# Patient Record
Sex: Female | Born: 1990 | Hispanic: No | Marital: Single | Smoking: Never smoker
Health system: Southern US, Community
[De-identification: ages and names within clinical notes are randomized; demographics above are authoritative.]

## PROBLEM LIST (undated history)

## (undated) ENCOUNTER — Inpatient Hospital Stay (HOSPITAL_COMMUNITY): Payer: Self-pay

## (undated) DIAGNOSIS — F32A Depression, unspecified: Secondary | ICD-10-CM

## (undated) DIAGNOSIS — F419 Anxiety disorder, unspecified: Secondary | ICD-10-CM

## (undated) DIAGNOSIS — F329 Major depressive disorder, single episode, unspecified: Secondary | ICD-10-CM

## (undated) HISTORY — PX: NASAL SINUS SURGERY: SHX719

---
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
## 2014-12-25 ENCOUNTER — Inpatient Hospital Stay (HOSPITAL_COMMUNITY): Payer: Managed Care, Other (non HMO)

## 2014-12-25 ENCOUNTER — Encounter (HOSPITAL_COMMUNITY): Payer: Self-pay | Admitting: *Deleted

## 2014-12-25 ENCOUNTER — Inpatient Hospital Stay (HOSPITAL_COMMUNITY)
Admission: AD | Admit: 2014-12-25 | Discharge: 2014-12-25 | Disposition: A | Discharge status: Home / Self Care | Payer: Managed Care, Other (non HMO) | Source: Ambulatory Visit | Attending: Obstetrics and Gynecology | Admitting: Obstetrics and Gynecology

## 2014-12-25 DIAGNOSIS — O209 Hemorrhage in early pregnancy, unspecified: Secondary | ICD-10-CM | POA: Diagnosis present

## 2014-12-25 DIAGNOSIS — Z3A01 Less than 8 weeks gestation of pregnancy: Secondary | ICD-10-CM | POA: Diagnosis not present

## 2014-12-25 DIAGNOSIS — O039 Complete or unspecified spontaneous abortion without complication: Secondary | ICD-10-CM | POA: Diagnosis not present

## 2014-12-25 LAB — CBC
HCT: 36 % (ref 36.0–46.0)
Hemoglobin: 12 g/dL (ref 12.0–15.0)
MCH: 28.8 pg (ref 26.0–34.0)
MCHC: 33.3 g/dL (ref 30.0–36.0)
MCV: 86.3 fL (ref 78.0–100.0)
Platelets: 229 10*3/uL (ref 150–400)
RBC: 4.17 MIL/uL (ref 3.87–5.11)
RDW: 12.1 % (ref 11.5–15.5)
WBC: 5.3 10*3/uL (ref 4.0–10.5)

## 2014-12-25 LAB — URINALYSIS, ROUTINE W REFLEX MICROSCOPIC
Bilirubin Urine: NEGATIVE
Glucose, UA: NEGATIVE mg/dL
Ketones, ur: 15 mg/dL — AB
Leukocytes, UA: NEGATIVE
Nitrite: NEGATIVE
Protein, ur: NEGATIVE mg/dL
Specific Gravity, Urine: 1.025 (ref 1.005–1.030)
Urobilinogen, UA: 0.2 mg/dL (ref 0.0–1.0)
pH: 6 (ref 5.0–8.0)

## 2014-12-25 LAB — URINE MICROSCOPIC-ADD ON

## 2014-12-25 LAB — POCT PREGNANCY, URINE: Preg Test, Ur: POSITIVE — AB

## 2014-12-25 LAB — HCG, QUANTITATIVE, PREGNANCY: HCG, BETA CHAIN, QUANT, S: 888 m[IU]/mL — AB (ref <5)

## 2014-12-25 MED ORDER — IBUPROFEN 800 MG PO TABS: 800.0000 mg | ORAL_TABLET | Freq: Three times a day (TID) | ORAL | Status: DC

## 2014-12-25 MED ORDER — HYDROCODONE-ACETAMINOPHEN 5-325 MG PO TABS: 1.0000 | ORAL_TABLET | ORAL | Status: DC | PRN

## 2014-12-25 MED ORDER — CYTOTEC 200 MCG PO TABS: 800.0000 ug | ORAL_TABLET | Freq: Once | ORAL | Status: DC

## 2014-12-25 NOTE — MAU Note (Addendum)
I just found out i am pregnant 3 days ago. Started having bleeding late last night. Mild cramping. Used 3 pads today. [facility] and [facility] pos upt. Could not see anything on u/s.

## 2014-12-25 NOTE — Discharge Instructions (Signed)
Miscarriage °A miscarriage is the loss of an unborn baby (fetus) before the 20th week of pregnancy. The cause is often unknown.  °HOME CARE °· You may need to stay in bed (bed rest), or you may be able to do light activity. Go about activity as told by your doctor. °· Have help at home. °· Write down how many pads you use each day. Write down how soaked they are. °· Do not use tampons. Do not wash out your vagina (douche) or have sex (intercourse) until your doctor approves. °· Only take medicine as told by your doctor. °· Do not take aspirin. °· Keep all doctor visits as told. °· If you or your partner have problems with grieving, talk to your doctor. You can also try counseling. Give yourself time to grieve before trying to get pregnant again. °GET HELP RIGHT AWAY IF: °· You have bad cramps or pain in your back or belly (abdomen). °· You have a fever. °· You pass large clumps of blood (clots) from your vagina that are walnut-sized or larger. Save the clumps for your doctor to see. °· You pass large amounts of tissue from your vagina. Save the tissue for your doctor to see. °· You have more bleeding. °· You have thick, bad-smelling fluid (discharge) coming from the vagina. °· You get lightheaded, weak, or you pass out (faint). °· You have chills. °MAKE SURE YOU: °· Understand these instructions. °· Will watch your condition. °· Will get help right away if you are not doing well or get worse. °Document Released: 12/25/2011 Document Reviewed: 12/25/2011 °ExitCare® Patient Information ©2015 [employer]. This information is not intended to replace advice given to you by your health care provider. Make sure you discuss any questions you have with your health care provider. ° °

## 2014-12-25 NOTE — MAU Provider Note (Signed)
History     CSN: [id number]  Arrival date and time: 12/25/14 4098   First Provider Initiated Contact with Patient 12/25/14 2024      Chief Complaint  Patient presents with  . Vaginal Bleeding   HPI   Ms Charlotte Scott is a 24 y.o. female G1P0 at [redacted]w[redacted]d who presents with vaginal bleeding that started last night.  This is the first time she has had bleeding in this pregnancy.   She was seen at her Dr. Isidore Moos on Wednesday and had an US done that showed a yolk sac only.   She complains of mild abdominal pain; bilateral   Patient plans to terminate this pregnancy.     OB History    Gravida Para Term Preterm AB TAB SAB Ectopic Multiple Living   1               History reviewed. No pertinent past medical history.  Past Surgical History  Procedure Laterality Date  . Nasal sinus surgery      History reviewed. No pertinent family history.  History  Substance Use Topics  . Smoking status: Never Smoker   . Smokeless tobacco: Not on file  . Alcohol Use: No    Allergies: No Known Allergies  No prescriptions prior to admission   Results for orders placed or performed during the hospital encounter of 12/25/14 (from the past 48 hour(s))  Urinalysis, Routine w reflex microscopic     Status: Abnormal   Collection Time: 12/25/14  7:50 PM  Result Value Ref Range   Color, Urine YELLOW YELLOW   APPearance HAZY (A) CLEAR   Specific Gravity, Urine 1.025 1.005 - 1.030   pH 6.0 5.0 - 8.0   Glucose, UA NEGATIVE NEGATIVE mg/dL   Hgb urine dipstick LARGE (A) NEGATIVE   Bilirubin Urine NEGATIVE NEGATIVE   Ketones, ur 15 (A) NEGATIVE mg/dL   Protein, ur NEGATIVE NEGATIVE mg/dL   Urobilinogen, UA 0.2 0.0 - 1.0 mg/dL   Nitrite NEGATIVE NEGATIVE   Leukocytes, UA NEGATIVE NEGATIVE  Urine microscopic-add on     Status: Abnormal   Collection Time: 12/25/14  7:50 PM  Result Value Ref Range   Squamous Epithelial / LPF FEW (A) RARE   WBC, UA 0-2 <3 WBC/hpf   RBC / HPF TOO NUMEROUS TO  COUNT <3 RBC/hpf   Bacteria, UA RARE RARE  Pregnancy, urine POC     Status: Abnormal   Collection Time: 12/25/14  7:55 PM  Result Value Ref Range   Preg Test, Ur POSITIVE (A) NEGATIVE    Comment:        THE SENSITIVITY OF THIS METHODOLOGY IS >24 mIU/mL   CBC     Status: None   Collection Time: 12/25/14  8:38 PM  Result Value Ref Range   WBC 5.3 4.0 - 10.5 K/uL   RBC 4.17 3.87 - 5.11 MIL/uL   Hemoglobin 12.0 12.0 - 15.0 g/dL   HCT 11.9 14.7 - 82.9 %   MCV 86.3 78.0 - 100.0 fL   MCH 28.8 26.0 - 34.0 pg   MCHC 33.3 30.0 - 36.0 g/dL   RDW 56.2 13.0 - 86.5 %   Platelets 229 150 - 400 K/uL  hCG, quantitative, pregnancy     Status: Abnormal   Collection Time: 12/25/14  8:38 PM  Result Value Ref Range   hCG, Beta Chain, Quant, S 888 (H) <5 mIU/mL    Comment:          GEST. AGE  CONC.  (mIU/mL)   <=1 WEEK        5 - 50     2 WEEKS       50 - 500     3 WEEKS       100 - 10,000     4 WEEKS     1,000 - 30,000     5 WEEKS     3,500 - 115,000   6-8 WEEKS     12,000 - 270,000    12 WEEKS     15,000 - 220,000        FEMALE AND NON-PREGNANT FEMALE:     LESS THAN 5 mIU/mL   ABO/Rh     Status: None (Preliminary result)   Collection Time: 12/25/14  8:38 PM  Result Value Ref Range   ABO/RH(D) A POS     US Ob Comp Less 14 Wks  12/25/2014   CLINICAL DATA:  24 year old female pregnant patient with one-day history of vaginal bleeding.  EXAM: OBSTETRIC <14 WK [place] AND TRANSVAGINAL OB US  TECHNIQUE: Both transabdominal and transvaginal ultrasound examinations were performed for complete evaluation of the gestation as well as the maternal uterus, adnexal regions, and pelvic cul-de-sac. Transvaginal technique was performed to assess early pregnancy.  COMPARISON:  No priors.  FINDINGS: Intrauterine gestational sac: Ovoid shaped gestational sac in the endometrial canal of the lower uterine segment.  Yolk sac:  None.  Embryo:  None.  Cardiac Activity: None.  Heart Rate:   bpm  MSD: 4.9  mm   5 w   0  d       [place] EDC: 08/27/2015  Maternal uterus/adnexae: No definite subchorionic hemorrhage. Right ovary could not be visualized. Left ovary was normal in appearance. No significant free fluid in the cul-de-sac.  IMPRESSION: Probable early intrauterine gestational sac, but no yolk sac, fetal pole, or cardiac activity yet visualized. Additionally, the gestational sac is low lying in the endometrial canal, concerning for impending spontaneous abortion. Recommend follow-up quantitative B-HCG levels and follow-up US in 14 days to confirm and assess viability. This recommendation follows SRU consensus guidelines: Diagnostic Criteria for Nonviable Pregnancy Early in the First Trimester. Malva Limes Med 2013; 811:9147-82.   Electronically Signed   By: Trudie Reed M.D.   On: 12/25/2014 22:39   US Ob Transvaginal  12/25/2014   CLINICAL DATA:  24 year old female pregnant patient with one-day history of vaginal bleeding.  EXAM: OBSTETRIC <14 WK [place] AND TRANSVAGINAL OB US  TECHNIQUE: Both transabdominal and transvaginal ultrasound examinations were performed for complete evaluation of the gestation as well as the maternal uterus, adnexal regions, and pelvic cul-de-sac. Transvaginal technique was performed to assess early pregnancy.  COMPARISON:  No priors.  FINDINGS: Intrauterine gestational sac: Ovoid shaped gestational sac in the endometrial canal of the lower uterine segment.  Yolk sac:  None.  Embryo:  None.  Cardiac Activity: None.  Heart Rate:   bpm  MSD: 4.9  mm   5 w   0  d      [place] EDC: 08/27/2015  Maternal uterus/adnexae: No definite subchorionic hemorrhage. Right ovary could not be visualized. Left ovary was normal in appearance. No significant free fluid in the cul-de-sac.  IMPRESSION: Probable early intrauterine gestational sac, but no yolk sac, fetal pole, or cardiac activity yet visualized. Additionally, the gestational sac is low lying in the endometrial canal, concerning for impending spontaneous abortion.  Recommend follow-up quantitative B-HCG levels and follow-up US in 14 days to confirm  and assess viability. This recommendation follows SRU consensus guidelines: Diagnostic Criteria for Nonviable Pregnancy Early in the First Trimester. Malva Limes Engl J Med 2013; 308:6578-46; 369:1443-51.   Electronically Signed   By: Trudie Reedaniel  Entrikin M.D.   On: 12/25/2014 22:39     Review of Systems  Constitutional: Negative for fever and chills.  Gastrointestinal: Positive for nausea and abdominal pain. Negative for vomiting.   Physical Exam   Blood pressure 115/83, pulse 69, temperature 98.4 F (36.9 C), resp. rate 18, height 5\' 7"  (1.702 m), weight 69.763 kg (153 lb 12.8 oz), last menstrual period 11/23/2014, SpO2 100 %.  Physical Exam  Constitutional: She is oriented to person, place, and time. She appears well-developed and well-nourished. No distress.  HENT:  Head: Normocephalic.  Eyes: Pupils are equal, round, and reactive to light.  Neck: Neck supple.  Cardiovascular: Normal rate.   Respiratory: Effort normal.  Genitourinary:  Speculum exam: Vagina - Small amount of dark red blood in the vaginal canal  Cervix -+ active bleeding; small amount  Bimanual exam: Cervix closed, no CMT  Uterus non tender, normal size Adnexa non tender, no masses bilaterally Chaperone present for exam.   Musculoskeletal: Normal range of motion.  Neurological: She is alert and oriented to person, place, and time.  Skin: Skin is warm. She is not diaphoretic.  Psychiatric: Her behavior is normal.    MAU Course  Procedures  None  MDM  US A positive blood type  Quant was 899; minimal change in value compared to quant done in the office on Wednesday.  Discussed patient with Dr. Ellyn HackBovard.   Assessment and Plan   A:  1. SAB (spontaneous abortion) ; in progress  2. Vaginal bleeding in pregnancy, first trimester   3. Vaginal bleeding in pregnancy, first trimester     P:  Discharge home in stable condition RX: Cytotec,  vicodin, ibuprofen Bleeding precautions discussed at length Pelvic rest Follow up with Dr. Beaulah DinningBovards office; call to schedule an appointment Return if symptoms worsen Support offered    Duane LopeJennifer I Farooq Petrovich, NP 12/25/2014 11:54 PM

## 2014-12-26 LAB — ABO/RH: ABO/RH(D): A POS

## 2015-10-30 ENCOUNTER — Encounter (HOSPITAL_COMMUNITY): Payer: Self-pay | Admitting: *Deleted

## 2016-08-16 ENCOUNTER — Inpatient Hospital Stay (HOSPITAL_COMMUNITY)
Admission: AD | Admit: 2016-08-16 | Discharge: 2016-08-16 | Disposition: A | Discharge status: Home / Self Care | Payer: 59 | Source: Ambulatory Visit | Attending: Obstetrics & Gynecology | Admitting: Obstetrics & Gynecology

## 2016-08-16 ENCOUNTER — Encounter (HOSPITAL_COMMUNITY): Payer: Self-pay

## 2016-08-16 DIAGNOSIS — N939 Abnormal uterine and vaginal bleeding, unspecified: Secondary | ICD-10-CM | POA: Insufficient documentation

## 2016-08-16 DIAGNOSIS — Z809 Family history of malignant neoplasm, unspecified: Secondary | ICD-10-CM | POA: Insufficient documentation

## 2016-08-16 DIAGNOSIS — Z3202 Encounter for pregnancy test, result negative: Secondary | ICD-10-CM | POA: Insufficient documentation

## 2016-08-16 LAB — CBC
HEMATOCRIT: 34.2 % — AB (ref 36.0–46.0)
HEMOGLOBIN: 11.6 g/dL — AB (ref 12.0–15.0)
MCH: 30.6 pg (ref 26.0–34.0)
MCHC: 33.9 g/dL (ref 30.0–36.0)
MCV: 90.2 fL (ref 78.0–100.0)
Platelets: 165 10*3/uL (ref 150–400)
RBC: 3.79 MIL/uL — ABNORMAL LOW (ref 3.87–5.11)
RDW: 12.5 % (ref 11.5–15.5)
WBC: 5.2 10*3/uL (ref 4.0–10.5)

## 2016-08-16 LAB — URINE MICROSCOPIC-ADD ON
BACTERIA UA: NONE SEEN
WBC, UA: NONE SEEN WBC/hpf (ref 0–5)

## 2016-08-16 LAB — URINALYSIS, ROUTINE W REFLEX MICROSCOPIC
BILIRUBIN URINE: NEGATIVE
GLUCOSE, UA: NEGATIVE mg/dL
Ketones, ur: 15 mg/dL — AB
Leukocytes, UA: NEGATIVE
Nitrite: NEGATIVE
Protein, ur: NEGATIVE mg/dL
SPECIFIC GRAVITY, URINE: 1.02 (ref 1.005–1.030)
pH: 6 (ref 5.0–8.0)

## 2016-08-16 LAB — WET PREP, GENITAL
Clue Cells Wet Prep HPF POC: NONE SEEN
SPERM: NONE SEEN
TRICH WET PREP: NONE SEEN
WBC WET PREP: NONE SEEN
YEAST WET PREP: NONE SEEN

## 2016-08-16 LAB — POCT PREGNANCY, URINE: Preg Test, Ur: NEGATIVE

## 2016-08-16 MED ORDER — IBUPROFEN 600 MG PO TABS
600.0000 mg | ORAL_TABLET | Freq: Once | ORAL | Status: AC
  Administered 2016-08-16: 600 mg via ORAL
  Filled 2016-08-16: qty 1

## 2016-08-16 MED ORDER — NORGESTIMATE-ETH ESTRADIOL 0.25-35 MG-MCG PO TABS: 1.0000 | ORAL_TABLET | Freq: Every day | ORAL | 4 refills | Status: AC

## 2016-08-16 NOTE — Discharge Instructions (Signed)

## 2016-08-16 NOTE — MAU Provider Note (Signed)
History     CSN: [id number]  Arrival date and time: 08/16/16 1318   First Provider Initiated Contact with Patient 08/16/16 1410      Chief Complaint  Patient presents with  . Vaginal Bleeding   HPI   [name] First is a 25 y.o. female G1P0 non-pregnant here with abnormal vaginal bleeding.  On Oct 11th she took plan B after unprotected sex.  Her last normal period was on Oct 1st, she started bleeding again 7 days ago, she is still bleeding. She is concerned because she is still bleeding. She is also concerned about pregnancy.   OB History    Gravida Para Term Preterm AB Living   1             SAB TAB Ectopic Multiple Live Births                  Past Medical History:  Diagnosis Date  . Medical history non-contributory     Past Surgical History:  Procedure Laterality Date  . NASAL SINUS SURGERY      Family History  Problem Relation Age of Onset  . Cancer Maternal Aunt   . Cancer Paternal Grandfather     Social History  Substance Use Topics  . Smoking status: Never Smoker  . Smokeless tobacco: Never Used  . Alcohol use No    Allergies: No Known Allergies  Prescriptions Prior to Admission  Medication Sig Dispense Refill Last Dose  . CYTOTEC 200 MCG tablet Place 4 tablets (800 mcg total) vaginally once. (Patient not taking: Reported on 08/16/2016) 4 tablet 0 Not Taking at Unknown time  . HYDROcodone-acetaminophen (NORCO/VICODIN) 5-325 MG per tablet Take 1-2 tablets by mouth every 4 (four) hours as needed. (Patient not taking: Reported on 08/16/2016) 10 tablet 0 Not Taking at Unknown time  . ibuprofen (ADVIL,MOTRIN) 800 MG tablet Take 1 tablet (800 mg total) by mouth 3 (three) times daily. (Patient not taking: Reported on 08/16/2016) 21 tablet 0 Not Taking at Unknown time   Recent Results (from the past 2160 hour(s))  GC/Chlamydia probe amp ([place]     Status: None   Collection Time: 08/16/16 12:00 AM  Result Value Ref Range   Chlamydia  Negative     Comment: Normal Reference Range - Negative   Neisseria gonorrhea Negative     Comment: Normal Reference Range - Negative  Urinalysis, Routine w reflex microscopic ([facility])     Status: Abnormal   Collection Time: 08/16/16  1:30 PM  Result Value Ref Range   Color, Urine YELLOW YELLOW   APPearance HAZY (A) CLEAR   Specific Gravity, Urine 1.020 1.005 - 1.030   pH 6.0 5.0 - 8.0   Glucose, UA NEGATIVE NEGATIVE mg/dL   Hgb urine dipstick LARGE (A) NEGATIVE   Bilirubin Urine NEGATIVE NEGATIVE   Ketones, ur 15 (A) NEGATIVE mg/dL   Protein, ur NEGATIVE NEGATIVE mg/dL   Nitrite NEGATIVE NEGATIVE   Leukocytes, UA NEGATIVE NEGATIVE  Urine microscopic-add on     Status: Abnormal   Collection Time: 08/16/16  1:30 PM  Result Value Ref Range   Squamous Epithelial / LPF 0-5 (A) NONE SEEN   WBC, UA NONE SEEN 0 - 5 WBC/hpf   RBC / HPF 6-30 0 - 5 RBC/hpf   Bacteria, UA NONE SEEN NONE SEEN  Pregnancy, urine POC     Status: None   Collection Time: 08/16/16  1:41 PM  Result Value Ref Range  Preg Test, Ur NEGATIVE NEGATIVE    Comment:        THE SENSITIVITY OF THIS METHODOLOGY IS >24 mIU/mL   Wet prep, genital     Status: None   Collection Time: 08/16/16  2:30 PM  Result Value Ref Range   Yeast Wet Prep HPF POC NONE SEEN NONE SEEN   Trich, Wet Prep NONE SEEN NONE SEEN   Clue Cells Wet Prep HPF POC NONE SEEN NONE SEEN   WBC, Wet Prep HPF POC NONE SEEN NONE SEEN    Comment: FEW BACTERIA SEEN   Sperm NONE SEEN   CBC     Status: Abnormal   Collection Time: 08/16/16  2:35 PM  Result Value Ref Range   WBC 5.2 4.0 - 10.5 K/uL   RBC 3.79 (L) 3.87 - 5.11 MIL/uL   Hemoglobin 11.6 (L) 12.0 - 15.0 g/dL   HCT 16.134.2 (L) 09.636.0 - 04.546.0 %   MCV 90.2 78.0 - 100.0 fL   MCH 30.6 26.0 - 34.0 pg   MCHC 33.9 30.0 - 36.0 g/dL   RDW 40.912.5 81.111.5 - 91.415.5 %   Platelets 165 150 - 400 K/uL    Review of Systems  Constitutional: Negative for chills and fever.  Gastrointestinal: Positive for abdominal  pain. Negative for nausea and vomiting.  Neurological: Negative for dizziness.   Physical Exam   Blood pressure 102/56, pulse 84, temperature 98.2 F (36.8 C), temperature source Oral, resp. rate 17, height 5\' 6"  (1.676 m), SpO2 98 %, unknown if currently breastfeeding.  Physical Exam  Constitutional: She is oriented to person, place, and time. She appears well-developed and well-nourished. No distress.  GI: Soft. She exhibits no distension. There is no tenderness. There is no rebound.  Genitourinary:  Genitourinary Comments: Vagina - Small amount of dark red blood in the vagina, no pooling.  Cervix - No contact bleeding, no active bleeding  Bimanual exam: Cervix closed Uterus non tender, normal size Adnexa non tender, no masses bilaterally GC/Chlam, wet prep done Chaperone present for exam.   Musculoskeletal: Normal range of motion.  Neurological: She is alert and oriented to person, place, and time.  Skin: Skin is warm. She is not diaphoretic.  Psychiatric: Her behavior is normal.    MAU Course  Procedures  None  MDM  UA Urine pregnancy test negative  Ibuprofen 600 mg PO per patient requests CBC    Assessment and Plan   A:  1. Abnormal uterine bleeding     P:  Discharge home in stable condition Rx: Sprintec Bleeding precautions Follow [place] with [place]YN in [place]   Jennifer I Rasch, NP 08/19/2016 9:02 PM

## 2016-08-16 NOTE — MAU Note (Signed)
Pt states she had a normal period at the beginning of October. She took Plan B around the second week of October. She started bleeding about three days after she took the Plan B. The bleeding lasted for a week. Pt states she thought she had a yeast infection about a week ago.  Pt states she started bleeding again about a week ago. Pt states the last three days the bleeding has been heavy and has had some clots. Pt states she has been bleeding through her pants at work and this isn't normal for her.

## 2016-08-17 LAB — GC/CHLAMYDIA PROBE AMP (~~LOC~~) NOT AT ARMC
Chlamydia: NEGATIVE
NEISSERIA GONORRHEA: NEGATIVE

## 2017-02-06 ENCOUNTER — Encounter (HOSPITAL_COMMUNITY): Payer: Self-pay | Admitting: *Deleted

## 2017-02-06 ENCOUNTER — Inpatient Hospital Stay (HOSPITAL_COMMUNITY)
Admission: AD | Admit: 2017-02-06 | Discharge: 2017-02-06 | Discharge status: Against Medical Advice | Payer: 59 | Source: Ambulatory Visit | Attending: Family Medicine | Admitting: Family Medicine

## 2017-02-06 DIAGNOSIS — R109 Unspecified abdominal pain: Secondary | ICD-10-CM

## 2017-02-06 DIAGNOSIS — N939 Abnormal uterine and vaginal bleeding, unspecified: Secondary | ICD-10-CM | POA: Insufficient documentation

## 2017-02-06 DIAGNOSIS — Z3202 Encounter for pregnancy test, result negative: Secondary | ICD-10-CM | POA: Insufficient documentation

## 2017-02-06 DIAGNOSIS — Z809 Family history of malignant neoplasm, unspecified: Secondary | ICD-10-CM | POA: Insufficient documentation

## 2017-02-06 HISTORY — DX: Depression, unspecified: F32.A

## 2017-02-06 HISTORY — DX: Anxiety disorder, unspecified: F41.9

## 2017-02-06 HISTORY — DX: Major depressive disorder, single episode, unspecified: F32.9

## 2017-02-06 LAB — URINALYSIS, ROUTINE W REFLEX MICROSCOPIC
BACTERIA UA: NONE SEEN
BILIRUBIN URINE: NEGATIVE
Glucose, UA: NEGATIVE mg/dL
Ketones, ur: NEGATIVE mg/dL
Leukocytes, UA: NEGATIVE
Nitrite: NEGATIVE
Protein, ur: NEGATIVE mg/dL
SPECIFIC GRAVITY, URINE: 1.029 (ref 1.005–1.030)
pH: 5 (ref 5.0–8.0)

## 2017-02-06 LAB — CBC
HEMATOCRIT: 37.3 % (ref 36.0–46.0)
Hemoglobin: 12.7 g/dL (ref 12.0–15.0)
MCH: 30.5 pg (ref 26.0–34.0)
MCHC: 34 g/dL (ref 30.0–36.0)
MCV: 89.7 fL (ref 78.0–100.0)
Platelets: 187 10*3/uL (ref 150–400)
RBC: 4.16 MIL/uL (ref 3.87–5.11)
RDW: 12.4 % (ref 11.5–15.5)
WBC: 3.5 10*3/uL — ABNORMAL LOW (ref 4.0–10.5)

## 2017-02-06 LAB — HCG, QUANTITATIVE, PREGNANCY

## 2017-02-06 LAB — POCT PREGNANCY, URINE: PREG TEST UR: NEGATIVE

## 2017-02-06 LAB — ABO/RH: ABO/RH(D): A POS

## 2017-02-06 NOTE — MAU Note (Signed)
Pt asking if she can leave and be called with lab results. Dr Eustace Pen on unit and aware and will talk with pt.

## 2017-02-06 NOTE — MAU Note (Signed)
Pt walked out of MAU with significant other before Dr Eustace Pen got to talk with pt.

## 2017-02-06 NOTE — MAU Note (Signed)
Pt went to Urgent Care yesterday, thought she might pass out, found out she is pregnant & that she might be having a miscarriage.  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  Passed several clots yesterday, unsure if some of it was tissue.  Has changed one pad today, bled through her sheets.  Having lower abd cramping.

## 2017-02-06 NOTE — MAU Provider Note (Signed)
MAU HISTORY AND PHYSICAL  Chief Complaint:  Vaginal Bleeding and Abdominal Pain   Charlotte Scott is a 26 y.o.  [phone] presenting for Vaginal Bleeding and Abdominal Pain  Patient states she has been having  Vaginal bleeding that started yesterday, passing large clots. She notes over the past year she has been having heavier menses and gets lightheaded and feels presyncopal. She felt lightheaded yesterday so went to ED. She was told she was pregnant based on Urine pregnancy test and was told she may be having a miscarriage. They did not check any blood work but wanted to give IVF, she states she got frustrated and left before any of this happened. She presents today due to concern of miscarriage. Notes LMP ~01/09/17 or around those days which would put her   Menstrual History: OB History    Gravida Para Term Preterm AB Living   2       2 0   SAB TAB Ectopic Multiple Live Births                  Patient's last menstrual period was 01/09/2017.      Past Medical History:  Diagnosis Date  . Anxiety   . Depression     Past Surgical History:  Procedure Laterality Date  . NASAL SINUS SURGERY      Family History  Problem Relation Age of Onset  . Cancer Maternal Aunt   . Cancer Paternal Grandfather     Social History  Substance Use Topics  . Smoking status: Never Smoker  . Smokeless tobacco: Never Used  . Alcohol use No     No Known Allergies  Prescriptions Prior to Admission  Medication Sig Dispense Refill Last Dose  . diphenhydrAMINE (BENADRYL) 25 MG tablet Take 25 mg by mouth every 6 (six) hours as needed.   Past Month at Unknown time  . Ibuprofen (MIDOL) 200 MG CAPS Take 2 capsules by mouth.    at 0930  . ibuprofen (ADVIL,MOTRIN) 800 MG tablet Take 1 tablet (800 mg total) by mouth 3 (three) times daily. (Patient not taking: Reported on 08/16/2016) 21 tablet 0 Not Taking at Unknown time  . norgestimate-ethinyl estradiol (ORTHO-CYCLEN,SPRINTEC,PREVIFEM) 0.25-35 MG-MCG  tablet Take 1 tablet by mouth daily. 1 Package 4     Review of Systems - Negative except for what is mentioned in HPI.  Physical Exam  Blood pressure (!) 125/96, pulse 67, temperature 98.1 F (36.7 C), temperature source Oral, height  (1.676 m), weight 59.4 kg (131 lb), last menstrual period 01/09/2017, unknown if currently breastfeeding. GENERAL: Well-developed, well-nourished female in no acute distress.  LUNGS: Clear to auscultation bilaterally.  HEART: Regular rate and rhythm. ABDOMEN: Soft, nontender, nondistended, gravid.  EXTREMITIES: Nontender, no edema, 2+ distal pulses. Cervical Exam: left prior to being done   Labs: Results for orders placed or performed during the hospital encounter of 02/06/17 (from the past 24 hour(s))  Urinalysis, Routine w reflex microscopic   Collection Time: 02/06/17 11:40 AM  Result Value Ref Range   Color, Urine YELLOW YELLOW   APPearance CLEAR CLEAR   Specific Gravity, Urine 1.029 1.005 - 1.030   pH 5.0 5.0 - 8.0   Glucose, UA NEGATIVE NEGATIVE mg/dL   Hgb urine dipstick LARGE (A) NEGATIVE   Bilirubin Urine NEGATIVE NEGATIVE   Ketones, ur NEGATIVE NEGATIVE mg/dL   Protein, ur NEGATIVE NEGATIVE mg/dL   Nitrite NEGATIVE NEGATIVE   Leukocytes, UA NEGATIVE NEGATIVE   RBC / HPF TOO NUMEROUS  TO COUNT 0 - 5 RBC/hpf   WBC, UA 0-5 0 - 5 WBC/hpf   Bacteria, UA NONE SEEN NONE SEEN   Squamous Epithelial / LPF 0-5 (A) NONE SEEN   Mucous PRESENT   Pregnancy, urine POC   Collection Time: 02/06/17 11:48 AM  Result Value Ref Range   Preg Test, Ur NEGATIVE NEGATIVE  CBC   Collection Time: 02/06/17 12:26 PM  Result Value Ref Range   WBC 3.5 (L) 4.0 - 10.5 K/uL   RBC 4.16 3.87 - 5.11 MIL/uL   Hemoglobin 12.7 12.0 - 15.0 g/dL   HCT 16.1 09.6 - 04.5 %   MCV 89.7 78.0 - 100.0 fL   MCH 30.5 26.0 - 34.0 pg   MCHC 34.0 30.0 - 36.0 g/dL   RDW 40.9 81.1 - 91.4 %   Platelets 187 150 - 400 K/uL  hCG, quantitative, pregnancy   Collection Time: 02/06/17  12:26 PM  Result Value Ref Range   hCG, Beta Chain, Quant, S <1 <5 mIU/mL  ABO/Rh   Collection Time: 02/06/17 12:26 PM  Result Value Ref Range   ABO/RH(D) A POS     Imaging Studies:  No results found.  Assessment: [name] is  26 y.o. G2P0020 at Unknown presents with Vaginal Bleeding and Abdominal Pain   Plan: -urine pregnancy test negative -given UPT reported as positive yesterday at [place], checked serum bHCG quant which was <1 -had dizziness yesterday, checked cbc which was wnl and pt not anemic -pt left ama prior to giving her negative pregnancy result.  Durenda Hurt 4/24/201811:42 AM    OB FELLOW MAU DISCHARGE ATTESTATION  I agree with above documentation in the resident's note. Patient left AMA before patient's results were completed and before SSE could be performed. Patient was stable prior to leaving AMA, and walked out on own without assistance. Reviewed labs and vital signs. Patient is NOT pregnancy (quant performed and is neg), Hb stable at 12, VSS.    Jen Mow, DO OB Fellow

## 2017-04-19 ENCOUNTER — Ambulatory Visit (INDEPENDENT_AMBULATORY_CARE_PROVIDER_SITE_OTHER): Payer: Self-pay | Admitting: Family Medicine

## 2017-04-19 ENCOUNTER — Encounter: Payer: Self-pay | Admitting: Family Medicine

## 2017-04-19 VITALS — BP 117/74 | HR 69 | Temp 98.2°F | Resp 16 | Ht 66.0 in | Wt 129.0 lb

## 2017-04-19 DIAGNOSIS — F32A Depression, unspecified: Secondary | ICD-10-CM

## 2017-04-19 DIAGNOSIS — R55 Syncope and collapse: Secondary | ICD-10-CM

## 2017-04-19 DIAGNOSIS — F329 Major depressive disorder, single episode, unspecified: Secondary | ICD-10-CM

## 2017-04-19 LAB — POCT CBC
Granulocyte percent: 62.4 %G (ref 37–80)
HEMATOCRIT: 38.7 % (ref 37.7–47.9)
HEMOGLOBIN: 12.5 g/dL (ref 12.2–16.2)
Lymph, poc: 1.6 (ref 0.6–3.4)
MCH, POC: 28.8 pg (ref 27–31.2)
MCHC: 32.3 g/dL (ref 31.8–35.4)
MCV: 89.3 fL (ref 80–97)
MID (cbc): 0.2 (ref 0–0.9)
MPV: 7.5 fL (ref 0–99.8)
POC GRANULOCYTE: 3.1 (ref 2–6.9)
POC LYMPH PERCENT: 32.6 %L (ref 10–50)
POC MID %: 5 % (ref 0–12)
Platelet Count, POC: 257 10*3/uL (ref 142–424)
RBC: 4.33 M/uL (ref 4.04–5.48)
RDW, POC: 12.4 %
WBC: 5 10*3/uL (ref 4.6–10.2)

## 2017-04-19 MED ORDER — SERTRALINE HCL 50 MG PO TABS: 50.0000 mg | ORAL_TABLET | Freq: Every day | ORAL | 2 refills | Status: DC

## 2017-04-19 NOTE — Progress Notes (Signed)
Patient ID: Charlotte Scott, female    DOB: [birth year]  Age: 26 y.o. MRN: [id number]  Chief Complaint  Patient presents with  . Loss of Consciousness    Pt states she passed out last night outside of her apartment complex - states this has happened in the past     Subjective:   26 year old lady who is being seen by me for the first time. She was sitting outside her apartment late last night talking on the telephone. She hasn't been aware of a little sensation of discomfort or rapid heart beating. She had stood up and passed out. She did fall to the ground with passing out. Her neighbor spotted her and was over her and worrying about her.  She has had these kind of episodes periodically, sometimes associated with her menstrual cycles. This time she is not on her menses. She has had an episode a month or so ago when she was too hot in the line at [employer].    She works as a Pensions consultant doing eyelashes. It is her own business, so she is not obligated to be present at work and sometimes has to cancel because she is not feeling well. She had been in a relationship until recently and that was discontinued. She does not seem to have a lot of other people close in her life. She has a fairly superficial a sharp with her family. She admits to being depressed and down at times, and has had thoughts about ending it though she doesn't know how she would do it. She has a therapist that she sees, though she has not seen the therapist for the past 2 weeks or so. She finds that digging in to her past and why she feels the way she does sometimes unearth's feelings in her that she doesn't want to do well on, and the session and has ended and she has to go on and deal with them.  She is gravida 2 para 0 with miscarriages.  Has some college level courses but not a degree. She got a certificate in working on eyelashes. She states that her spiritual background is [employer] though she is not active in church or active in  Bible reading she does pray.  Current allergies, medications, problem list, past/family and social histories reviewed.  Objective:  BP 117/74   Pulse 69   Temp 98.2 F (36.8 C) (Oral)   Resp 16   Ht 5\' 6"  (1.676 m)   Wt 129 lb (58.5 kg)   LMP 03/30/2017   SpO2 98%   BMI 20.82 kg/m   Pleasant young lady, somewhat flat affect. Alert and oriented. Eyes PERRLA. Fundi benign. Throat clear. Neck supple without nodes or thyromegaly. No carotid bruits. Chest clear. Heart regular without murmur. Abdomen soft and nontender. Extremities normal.  Assessment & Plan:   Assessment: 1. Vasovagal syncope   2. Depression, unspecified depression type       Plan: We will do some baseline labs because of the recurrent syncopal episodes. Will discuss further with her the aspects of how her motions tie into all of this.  Orders Placed This Encounter  Procedures  . Comprehensive metabolic panel  . TSH  . POCT CBC  . EKG 12-Lead    Meds ordered this encounter  Medications  . sertraline (ZOLOFT) 50 MG tablet    Sig: Take 1 tablet (50 mg total) by mouth daily.    Dispense:  30 tablet    Refill:  2  EKG is normal  Results for orders placed or performed in visit on 04/19/17  POCT CBC  Result Value Ref Range   WBC 5.0 4.6 - 10.2 K/uL   Lymph, poc 1.6 0.6 - 3.4   POC LYMPH PERCENT 32.6 10 - 50 %L   MID (cbc) 0.2 0 - 0.9   POC MID % 5.0 0 - 12 %M   POC Granulocyte 3.1 2 - 6.9   Granulocyte percent 62.4 37 - 80 %G   RBC 4.33 4.04 - 5.48 M/uL   Hemoglobin 12.5 12.2 - 16.2 g/dL   HCT, POC 16.1 09.6 - 47.9 %   MCV 89.3 80 - 97 fL   MCH, POC 28.8 27 - 31.2 pg   MCHC 32.3 31.8 - 35.4 g/dL   RDW, POC 04.5 %   Platelet Count, POC 257 142 - 424 K/uL   MPV 7.5 0 - 99.8 fL         Patient Instructions  Drink plenty of fluids regularly  Try to eat a more regular basis  Try to get more regular exercise  Avoid use of substances  We will let you know the results of your remaining  labs sometime in the next week or so  Begin taking sertraline 50 mg 1 each morning for depression. It takes being on it for about 3 weeks before you start realizing that you're feeling significantly better.  Recommend continuing seeing a therapist. If you desire referral to a psychiatrist at any time we can do so.  Work hard on developing one or more friends that you can talk with freely.  Recommend more spiritual involvement in your life, such as getting into a church regularly.  If you're having further episodes of passing out please return for reassessment. If you feel lightheaded, always try to sit down(even if you're in a public place where it is a little embarrassing) and projection head down between your legs if you can. It is better to be embarrassed by the circumstances and then to pass out.  Return in about 1 month for a recheck. I recommend that you talk to the desk and make an appointment with one of the female PAs or physicians for them to be your regular provider.  If any time you feel extremely depressed such that you are having suicidal thoughts, get help immediately from someone or go to the emergency room or come here.   Vasovagal Syncope, Adult Syncope, which is commonly known as fainting or passing out, is a temporary loss of consciousness. It occurs when the blood flow to the brain is reduced. Vasovagal syncope, also called neurocardiogenic syncope, is a fainting spell that happens when blood flow to the brain is reduced because of a sudden drop in heart rate and blood pressure. Vasovagal syncope is usually harmless. However, you can get injured if you fall during a fainting spell. What are the causes? This condition is caused by a drop in heart rate and blood pressure, usually in response to a trigger. Many things and situations can trigger an episode, including:  Pain.  Fear.  The sight of blood. This may occur during medical procedures, such as when blood is being  drawn from a vein.  Common activities, such as coughing, swallowing, stretching, or going to the bathroom.  Emotional stress.  Being in a confined space.  Prolonged standing, especially in a warm environment.  Lack of sleep or rest.  Not eating for a long time.  Not drinking enough  liquids.  Recent illness.  Drinking alcohol.  Taking drugs that affect blood pressure, such as marijuana, cocaine, opiates, or inhalants.  What are the signs or symptoms? Before a fainting episode, you may:  Feel dizzy or light-headed.  Become pale.  Sense that you are going to faint.  Feel like the room is spinning.  Only see directly ahead (tunnel vision).  Feel sick to your stomach (nauseous).  See spots.  Slowly lose vision.  Hear ringing in your ears.  Have a headache.  Feel warm and sweaty.  Feel a sensation of pins and needles.  During the fainting spell, you may twitch or make jerky movements. Fainting spells usually last no longer than a few minutes before you wake up. If you get up too quickly before your body can recover, you may faint again. How is this diagnosed? This condition is diagnosed based on your symptoms, your medical history, and a physical exam. Tests may be done to rule out other causes of fainting. Tests may include:  Blood tests.  Heart tests, such as an electrocardiogram (ECG), echocardiogram, or electrophysiology study.  A test to check your response to changes in position (tilt table test).  How is this treated? Usually, treatment is not needed for this condition. Your health care provider may suggest ways to help prevent fainting episodes. These may include:  Drinking additional fluids if you are exposed to a trigger.  Sitting or lying down if you notice signs that an episode is coming.  If your fainting spells continue, your health care provider may recommend that you:  Take medicines to prevent fainting or to help reduce further episodes  of fainting.  Do certain exercises.  Wear compression stockings.  Have surgery to place a pacemaker in your body (rare).  Follow these instructions at home:  Learn to identify the signs that an episode is coming.  Sit or lie down at the first sign of a fainting spell. If you sit down, put your head down between your legs. If you lie down, swing your legs up in the air to increase blood flow to the brain.  Avoid hot tubs and saunas.  Avoid standing for a long time. If you have to stand for a long time, try: ? Crossing your legs. ? Flexing and stretching your leg muscles. ? Squatting. ? Moving your legs. ? Bending over.  Drink enough fluid to keep your urine clear or pale yellow.  Make changes to your diet that your health care provider recommends. You may be told to: ? Avoid caffeine. ? Eat more salt.  Take over-the-counter and prescription medicines only as told by your health care provider. Contact a health care provider if:  You continue to have fainting spells despite treatment.  You faint more often despite treatment.  You lose consciousness for more than a few minutes.  You faint during or after exercising or after being startled.  You have twitching or jerky movements for longer than a few seconds during a fainting spell.  You have an episode of twitching or jerky movements without fainting. Get help right away if:  A fainting spell leads to an injury or bleeding.  You have new symptoms that occur with the fainting spells, such as: ? Shortness of breath. ? Chest pain. ? Irregular heartbeat.  You twitch or make jerky movements for more than 5 minutes.  You twitch or make jerky movements during more than one fainting spell. This information is not intended to replace advice  given to you by your health care provider. Make sure you discuss any questions you have with your health care provider. Document Released: 09/18/2012 Document Revised: 03/15/2016  Document Reviewed: 07/31/2015 Elsevier Interactive Patient Education  [employer]2018 [employer]     Return in about 4 weeks (around 05/17/2017).   Jenisha Faison, MD 04/19/2017

## 2017-04-19 NOTE — Patient Instructions (Signed)
Drink plenty of fluids regularly  Try to eat a more regular basis  Try to get more regular exercise  Avoid use of substances  We will let you know the results of your remaining labs sometime in the next week or so  Begin taking sertraline 50 mg 1 each morning for depression. It takes being on it for about 3 weeks before you start realizing that you're feeling significantly better.  Recommend continuing seeing a therapist. If you desire referral to a psychiatrist at any time we can do so.  Work hard on developing one or more friends that you can talk with freely.  Recommend more spiritual involvement in your life, such as getting into a church regularly.  If you're having further episodes of passing out please return for reassessment. If you feel lightheaded, always try to sit down(even if you're in a public place where it is a little embarrassing) and projection head down between your legs if you can. It is better to be embarrassed by the circumstances and then to pass out.  Return in about 1 month for a recheck. I recommend that you talk to the desk and make an appointment with one of the female PAs or physicians for them to be your regular provider.  If any time you feel extremely depressed such that you are having suicidal thoughts, get help immediately from someone or go to the emergency room or come here.   Vasovagal Syncope, Adult Syncope, which is commonly known as fainting or passing out, is a temporary loss of consciousness. It occurs when the blood flow to the brain is reduced. Vasovagal syncope, also called neurocardiogenic syncope, is a fainting spell that happens when blood flow to the brain is reduced because of a sudden drop in heart rate and blood pressure. Vasovagal syncope is usually harmless. However, you can get injured if you fall during a fainting spell. What are the causes? This condition is caused by a drop in heart rate and blood pressure, usually in response to  a trigger. Many things and situations can trigger an episode, including:  Pain.  Fear.  The sight of blood. This may occur during medical procedures, such as when blood is being drawn from a vein.  Common activities, such as coughing, swallowing, stretching, or going to the bathroom.  Emotional stress.  Being in a confined space.  Prolonged standing, especially in a warm environment.  Lack of sleep or rest.  Not eating for a long time.  Not drinking enough liquids.  Recent illness.  Drinking alcohol.  Taking drugs that affect blood pressure, such as marijuana, cocaine, opiates, or inhalants.  What are the signs or symptoms? Before a fainting episode, you may:  Feel dizzy or light-headed.  Become pale.  Sense that you are going to faint.  Feel like the room is spinning.  Only see directly ahead (tunnel vision).  Feel sick to your stomach (nauseous).  See spots.  Slowly lose vision.  Hear ringing in your ears.  Have a headache.  Feel warm and sweaty.  Feel a sensation of pins and needles.  During the fainting spell, you may twitch or make jerky movements. Fainting spells usually last no longer than a few minutes before you wake up. If you get up too quickly before your body can recover, you may faint again. How is this diagnosed? This condition is diagnosed based on your symptoms, your medical history, and a physical exam. Tests may be done to rule out other  causes of fainting. Tests may include:  Blood tests.  Heart tests, such as an electrocardiogram (ECG), echocardiogram, or electrophysiology study.  A test to check your response to changes in position (tilt table test).  How is this treated? Usually, treatment is not needed for this condition. Your health care provider may suggest ways to help prevent fainting episodes. These may include:  Drinking additional fluids if you are exposed to a trigger.  Sitting or lying down if you notice signs that  an episode is coming.  If your fainting spells continue, your health care provider may recommend that you:  Take medicines to prevent fainting or to help reduce further episodes of fainting.  Do certain exercises.  Wear compression stockings.  Have surgery to place a pacemaker in your body (rare).  Follow these instructions at home:  Learn to identify the signs that an episode is coming.  Sit or lie down at the first sign of a fainting spell. If you sit down, put your head down between your legs. If you lie down, swing your legs up in the air to increase blood flow to the brain.  Avoid hot tubs and saunas.  Avoid standing for a long time. If you have to stand for a long time, try: ? Crossing your legs. ? Flexing and stretching your leg muscles. ? Squatting. ? Moving your legs. ? Bending over.  Drink enough fluid to keep your urine clear or pale yellow.  Make changes to your diet that your health care provider recommends. You may be told to: ? Avoid caffeine. ? Eat more salt.  Take over-the-counter and prescription medicines only as told by your health care provider. Contact a health care provider if:  You continue to have fainting spells despite treatment.  You faint more often despite treatment.  You lose consciousness for more than a few minutes.  You faint during or after exercising or after being startled.  You have twitching or jerky movements for longer than a few seconds during a fainting spell.  You have an episode of twitching or jerky movements without fainting. Get help right away if:  A fainting spell leads to an injury or bleeding.  You have new symptoms that occur with the fainting spells, such as: ? Shortness of breath. ? Chest pain. ? Irregular heartbeat.  You twitch or make jerky movements for more than 5 minutes.  You twitch or make jerky movements during more than one fainting spell. This information is not intended to replace advice given  to you by your health care provider. Make sure you discuss any questions you have with your health care provider. Document Released: 09/18/2012 Document Revised: 03/15/2016 Document Reviewed: 07/31/2015 Elsevier Interactive Patient Education  [employer].

## 2017-04-20 LAB — COMPREHENSIVE METABOLIC PANEL
A/G RATIO: 1.5 (ref 1.2–2.2)
ALBUMIN: 4.6 g/dL (ref 3.5–5.5)
ALT: 9 IU/L (ref 0–32)
AST: 15 IU/L (ref 0–40)
Alkaline Phosphatase: 49 IU/L (ref 39–117)
BUN / CREAT RATIO: 13 (ref 9–23)
BUN: 10 mg/dL (ref 6–20)
Bilirubin Total: 0.8 mg/dL (ref 0.0–1.2)
CALCIUM: 9.4 mg/dL (ref 8.7–10.2)
CO2: 20 mmol/L (ref 20–29)
CREATININE: 0.75 mg/dL (ref 0.57–1.00)
Chloride: 103 mmol/L (ref 96–106)
GFR, EST AFRICAN AMERICAN: 129 mL/min/{1.73_m2} (ref >59)
GFR, EST NON AFRICAN AMERICAN: 112 mL/min/{1.73_m2} (ref >59)
GLOBULIN, TOTAL: 3 g/dL (ref 1.5–4.5)
Glucose: 94 mg/dL (ref 65–99)
POTASSIUM: 4.1 mmol/L (ref 3.5–5.2)
SODIUM: 140 mmol/L (ref 134–144)
TOTAL PROTEIN: 7.6 g/dL (ref 6.0–8.5)

## 2017-04-20 LAB — TSH: TSH: 0.724 u[IU]/mL (ref 0.450–4.500)

## 2017-05-02 ENCOUNTER — Ambulatory Visit (HOSPITAL_COMMUNITY)
Admission: RE | Admit: 2017-05-02 | Discharge: 2017-05-02 | Disposition: A | Discharge status: Home / Self Care | Payer: BLUE CROSS/BLUE SHIELD | Attending: Psychiatry | Admitting: Psychiatry

## 2017-05-02 ENCOUNTER — Emergency Department (HOSPITAL_COMMUNITY)
Admission: EM | Admit: 2017-05-02 | Discharge: 2017-05-02 | Disposition: A | Discharge status: Home / Self Care | Payer: BLUE CROSS/BLUE SHIELD | Attending: Emergency Medicine | Admitting: Emergency Medicine

## 2017-05-02 DIAGNOSIS — Z5321 Procedure and treatment not carried out due to patient leaving prior to being seen by health care provider: Secondary | ICD-10-CM | POA: Insufficient documentation

## 2017-05-02 DIAGNOSIS — F12929 Cannabis use, unspecified with intoxication, unspecified: Secondary | ICD-10-CM | POA: Insufficient documentation

## 2017-05-02 DIAGNOSIS — F339 Major depressive disorder, recurrent, unspecified: Secondary | ICD-10-CM | POA: Insufficient documentation

## 2017-05-02 DIAGNOSIS — Z046 Encounter for general psychiatric examination, requested by authority: Secondary | ICD-10-CM | POA: Diagnosis present

## 2017-05-02 NOTE — H&P (Signed)
Behavioral Health Medical Screening Exam  Charlotte Scott is an 26 y.o. female accompanied with her sister endorsing worsening depressive symptoms over the past 4 months, illicited by a break up with her BF of 3 years. She has been endorsing SI/SA with intent/plan. She is endorsing sadness, despair, hopelessness and mood volatility. She is denying any acute medical concerns and or acute on chronic medical issues.  Total Time spent with patient: 15 minutes  Psychiatric Specialty Exam: Physical Exam  Constitutional: She is oriented to person, place, and time. She appears well-developed and well-nourished. No distress.  HENT:  Head: Normocephalic.  Eyes: Pupils are equal, round, and reactive to light.  Respiratory: Effort normal and breath sounds normal.  Neurological: She is alert and oriented to person, place, and time. No cranial nerve deficit.  Skin: Skin is warm and dry. She is not diaphoretic.  Psychiatric: Her speech is normal. Her mood appears anxious. Her affect is angry. She is agitated. Cognition and memory are normal. She expresses impulsivity. She exhibits a depressed mood. She expresses suicidal ideation. She expresses suicidal plans.    Review of Systems  Psychiatric/Behavioral: Positive for depression, substance abuse and suicidal ideas.  All other systems reviewed and are negative.   unknown if currently breastfeeding.There is no height or weight on file to calculate BMI.  General Appearance: Casual  Eye Contact:  Good  Speech:  Clear and Coherent  Volume:  Normal  Mood:  Depressed  Affect:  Congruent  Thought Process:  Goal Directed  Orientation:  Full (Time, Place, and Person)  Thought Content:  Negative  Suicidal Thoughts:  Yes.  with intent/plan  Homicidal Thoughts:  No  Memory:  Immediate;   Good  Judgement:  Poor  Insight:  Lacking  Psychomotor Activity:  Negative  Concentration: Concentration: Poor  Recall:  Fair  Fund of Knowledge:Good  Language: Good   Akathisia:  Negative  Handed:  Right  AIMS (if indicated):     Assets:  Desire for Improvement  Sleep:       Musculoskeletal: Strength & Muscle Tone: within normal limits Gait & Station: normal Patient leans: N/A  unknown if currently breastfeeding.  Recommendations:  Based on my evaluation the patient does not appear to have an emergency medical condition.  Kerry HoughSpencer E Medardo Hassing, PA-C 05/02/2017, 10:17 PM
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
## 2017-05-02 NOTE — ED Notes (Signed)
Pt stated to this writer that she did not want to stay here tonight and that [facility]

## 2017-05-03 ENCOUNTER — Emergency Department (HOSPITAL_COMMUNITY)
Admission: EM | Admit: 2017-05-03 | Discharge: 2017-05-04 | Disposition: A | Discharge status: Other Acute Inpatient Hospital | Payer: BLUE CROSS/BLUE SHIELD | Attending: Emergency Medicine | Admitting: Emergency Medicine

## 2017-05-03 ENCOUNTER — Ambulatory Visit (HOSPITAL_COMMUNITY)
Admission: RE | Admit: 2017-05-03 | Discharge: 2017-05-03 | Disposition: A | Discharge status: Home / Self Care | Payer: BLUE CROSS/BLUE SHIELD | Attending: Psychiatry | Admitting: Psychiatry

## 2017-05-03 ENCOUNTER — Encounter (HOSPITAL_COMMUNITY): Payer: Self-pay | Admitting: Emergency Medicine

## 2017-05-03 DIAGNOSIS — F329 Major depressive disorder, single episode, unspecified: Secondary | ICD-10-CM | POA: Diagnosis not present

## 2017-05-03 DIAGNOSIS — F322 Major depressive disorder, single episode, severe without psychotic features: Secondary | ICD-10-CM | POA: Diagnosis not present

## 2017-05-03 DIAGNOSIS — F332 Major depressive disorder, recurrent severe without psychotic features: Secondary | ICD-10-CM

## 2017-05-03 DIAGNOSIS — Z79899 Other long term (current) drug therapy: Secondary | ICD-10-CM | POA: Insufficient documentation

## 2017-05-03 DIAGNOSIS — R45851 Suicidal ideations: Secondary | ICD-10-CM | POA: Insufficient documentation

## 2017-05-03 LAB — COMPREHENSIVE METABOLIC PANEL
ALT: 11 U/L — AB (ref 14–54)
AST: 19 U/L (ref 15–41)
Albumin: 4.7 g/dL (ref 3.5–5.0)
Alkaline Phosphatase: 39 U/L (ref 38–126)
Anion gap: 10 (ref 5–15)
BUN: 12 mg/dL (ref 6–20)
CHLORIDE: 102 mmol/L (ref 101–111)
CO2: 25 mmol/L (ref 22–32)
CREATININE: 0.87 mg/dL (ref 0.44–1.00)
Calcium: 9.3 mg/dL (ref 8.9–10.3)
Glucose, Bld: 128 mg/dL — ABNORMAL HIGH (ref 65–99)
Potassium: 3.5 mmol/L (ref 3.5–5.1)
Sodium: 137 mmol/L (ref 135–145)
Total Bilirubin: 0.7 mg/dL (ref 0.3–1.2)
Total Protein: 7.9 g/dL (ref 6.5–8.1)

## 2017-05-03 LAB — CBC WITH DIFFERENTIAL/PLATELET
BASOS ABS: 0 10*3/uL (ref 0.0–0.1)
BASOS PCT: 1 %
EOS ABS: 0.1 10*3/uL (ref 0.0–0.7)
EOS PCT: 1 %
HCT: 36.4 % (ref 36.0–46.0)
Hemoglobin: 12.3 g/dL (ref 12.0–15.0)
LYMPHS PCT: 35 %
Lymphs Abs: 2.4 10*3/uL (ref 0.7–4.0)
MCH: 29.2 pg (ref 26.0–34.0)
MCHC: 33.8 g/dL (ref 30.0–36.0)
MCV: 86.5 fL (ref 78.0–100.0)
Monocytes Absolute: 0.6 10*3/uL (ref 0.1–1.0)
Monocytes Relative: 10 %
Neutro Abs: 3.6 10*3/uL (ref 1.7–7.7)
Neutrophils Relative %: 53 %
PLATELETS: 230 10*3/uL (ref 150–400)
RBC: 4.21 MIL/uL (ref 3.87–5.11)
RDW: 12 % (ref 11.5–15.5)
WBC: 6.7 10*3/uL (ref 4.0–10.5)

## 2017-05-03 LAB — I-STAT BETA HCG BLOOD, ED (MC, WL, AP ONLY)

## 2017-05-03 LAB — ETHANOL

## 2017-05-03 NOTE — BH Assessment (Signed)
[facility], pt has been IVCd by father. [facility] Charge RN has been notified.   [id number]Jersee Winiarski Keys[place]LPC, LCAS
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
## 2017-05-03 NOTE — ED Triage Notes (Signed)
Pt after break with boyfriend verbalized suicidal ideation and was IVC'd

## 2017-05-03 NOTE — H&P (Signed)
Behavioral Health Medical Screening Exam  Charlotte Scott is an 26 y.o. female walk-in who presents with her mother. Patient is suicidal with a plan to shoot herself.  Patient took a gun from parents house, parents were able to retrieve gun and have secured in another location.   Total Time spent with patient: 15 minutes  Psychiatric Specialty Exam: Physical Exam  Constitutional: She is oriented to person, place, and time. She appears well-nourished. No distress.  HENT:  Head: Normocephalic and atraumatic.  Right Ear: External ear normal.  Left Ear: External ear normal.  Eyes: Pupils are equal, round, and reactive to light. Conjunctivae are normal. Right eye exhibits no discharge. Left eye exhibits no discharge. No scleral icterus.  Neck: Normal range of motion.  Cardiovascular: Normal rate, regular rhythm and normal heart sounds.   Respiratory: Effort normal and breath sounds normal. No respiratory distress.  Musculoskeletal: Normal range of motion.  Neurological: She is alert and oriented to person, place, and time.  Skin: Skin is warm and dry. She is not diaphoretic.  Psychiatric: Her speech is normal. Her mood appears anxious. Thought content is not paranoid and not delusional. Cognition and memory are normal. She expresses impulsivity and inappropriate judgment. She exhibits a depressed mood. She expresses suicidal ideation. She expresses no homicidal ideation. She expresses suicidal plans.    Review of Systems  Psychiatric/Behavioral: Positive for depression, substance abuse and suicidal ideas. Negative for hallucinations and memory loss. The patient is nervous/anxious and has insomnia.   All other systems reviewed and are negative.   Blood pressure 114/74, pulse 73, temperature 98.3 F (36.8 C), temperature source Oral, resp. rate 18, SpO2 100 %, unknown if currently breastfeeding.There is no height or weight on file to calculate BMI.  General Appearance: Casual and Fairly  Groomed  Eye Contact:  Good  Speech:  Clear and Coherent and Normal Rate  Volume:  Normal  Mood:  Anxious, Depressed, Hopeless, Irritable and Worthless  Affect:  Appropriate, Congruent and Depressed  Thought Process:  Coherent  Orientation:  Full (Time, Place, and Person)  Thought Content:  Logical, Hallucinations: None and Rumination  Suicidal Thoughts:  Yes.  with intent/plan  Homicidal Thoughts:  No  Memory:  Immediate;   Good Recent;   Good Remote;   Good  Judgement:  Impaired  Insight:  Lacking  Psychomotor Activity:  Normal  Concentration: Concentration: Fair and Attention Span: Fair  Recall:  Good  Fund of Knowledge:Fair  Language: Good  Akathisia:  NA  Handed:  Right  AIMS (if indicated):     Assets:  ArchitectCommunication Skills Financial Resources/Insurance Housing Leisure Time Physical Health Social Support Transportation  Sleep:       Musculoskeletal: Strength & Muscle Tone: within normal limits Gait & Station: normal   Blood pressure 114/74, pulse 73, temperature 98.3 F (36.8 C), temperature source Oral, resp. rate 18, SpO2 100 %, unknown if currently breastfeeding.  Recommendations:  Based on my evaluation the patient does not appear to have an emergency medical condition. No beds available. Patient IVCd by father. [facility]  Jackelyn PolingJason A Maddox Hlavaty, NP 05/03/2017, 9:12 PM

## 2017-05-04 ENCOUNTER — Encounter (HOSPITAL_COMMUNITY): Payer: Self-pay

## 2017-05-04 ENCOUNTER — Inpatient Hospital Stay (HOSPITAL_COMMUNITY)
Admission: AD | Admit: 2017-05-04 | Discharge: 2017-05-07 | DRG: 885 | Disposition: A | Discharge status: Home / Self Care | Payer: BLUE CROSS/BLUE SHIELD | Attending: Psychiatry | Admitting: Psychiatry

## 2017-05-04 DIAGNOSIS — F1290 Cannabis use, unspecified, uncomplicated: Secondary | ICD-10-CM | POA: Diagnosis present

## 2017-05-04 DIAGNOSIS — F332 Major depressive disorder, recurrent severe without psychotic features: Secondary | ICD-10-CM | POA: Diagnosis present

## 2017-05-04 DIAGNOSIS — R45851 Suicidal ideations: Secondary | ICD-10-CM | POA: Diagnosis present

## 2017-05-04 DIAGNOSIS — Z79899 Other long term (current) drug therapy: Secondary | ICD-10-CM | POA: Diagnosis not present

## 2017-05-04 DIAGNOSIS — F341 Dysthymic disorder: Secondary | ICD-10-CM

## 2017-05-04 DIAGNOSIS — F4320 Adjustment disorder, unspecified: Secondary | ICD-10-CM

## 2017-05-04 DIAGNOSIS — F419 Anxiety disorder, unspecified: Secondary | ICD-10-CM | POA: Diagnosis present

## 2017-05-04 DIAGNOSIS — F322 Major depressive disorder, single episode, severe without psychotic features: Secondary | ICD-10-CM | POA: Diagnosis not present

## 2017-05-04 LAB — RAPID URINE DRUG SCREEN, HOSP PERFORMED
Amphetamines: NOT DETECTED
BARBITURATES: NOT DETECTED
Benzodiazepines: NOT DETECTED
COCAINE: NOT DETECTED
OPIATES: NOT DETECTED
Tetrahydrocannabinol: POSITIVE — AB

## 2017-05-04 MED ORDER — ONDANSETRON HCL 4 MG PO TABS
4.0000 mg | ORAL_TABLET | Freq: Three times a day (TID) | ORAL | Status: DC | PRN
Start: 2017-05-04 — End: 2017-05-07

## 2017-05-04 MED ORDER — HYDROXYZINE HCL 25 MG PO TABS
25.0000 mg | ORAL_TABLET | Freq: Once | ORAL | Status: AC
  Administered 2017-05-04: 25 mg via ORAL
  Filled 2017-05-04: qty 1

## 2017-05-04 MED ORDER — ALUM & MAG HYDROXIDE-SIMETH 200-200-20 MG/5ML PO SUSP: 30.0000 mL | ORAL | Status: DC | PRN

## 2017-05-04 MED ORDER — IBUPROFEN 200 MG PO TABS: 600.0000 mg | ORAL_TABLET | Freq: Three times a day (TID) | ORAL | Status: DC | PRN

## 2017-05-04 MED ORDER — IBUPROFEN 600 MG PO TABS: 600.0000 mg | ORAL_TABLET | Freq: Three times a day (TID) | ORAL | Status: DC | PRN

## 2017-05-04 MED ORDER — NORGESTIMATE-ETH ESTRADIOL 0.25-35 MG-MCG PO TABS: 1.0000 | ORAL_TABLET | Freq: Every day | ORAL | Status: DC

## 2017-05-04 MED ORDER — LORAZEPAM 1 MG PO TABS
1.0000 mg | ORAL_TABLET | Freq: Once | ORAL | Status: AC
  Administered 2017-05-04: 1 mg via ORAL
  Filled 2017-05-04: qty 1

## 2017-05-04 MED ORDER — ONDANSETRON HCL 4 MG PO TABS: 4.0000 mg | ORAL_TABLET | Freq: Three times a day (TID) | ORAL | Status: DC | PRN

## 2017-05-04 MED ORDER — MAGNESIUM HYDROXIDE 400 MG/5ML PO SUSP: 30.0000 mL | Freq: Every day | ORAL | Status: DC | PRN

## 2017-05-04 MED ORDER — ACETAMINOPHEN 325 MG PO TABS: 650.0000 mg | ORAL_TABLET | Freq: Four times a day (QID) | ORAL | Status: DC | PRN

## 2017-05-04 NOTE — ED Notes (Signed)
[facility].
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
## 2017-05-04 NOTE — BH Assessment (Signed)
[facility] Assessment Progress Note  Per Nira ConnJason Berry, NP, this pt requires psychiatric hospitalization.  Berneice Heinrichina Tate, RN, [facility]  Pt presents under IVC initiated by her father, and upheld by EDP Zadie Rhineonald Wickline, MD, and [facility].  Pt's nurse, Aram BeechamCynthia, has been notified, and agrees to call report to [phone].  Pt is to be transported via [employer].   Doylene Canninghomas Karrington Mccravy, MA Triage Specialist [phone]

## 2017-05-04 NOTE — ED Provider Notes (Signed)
[facility]-EMERGENCY DEPT Provider Note   CSN: [id number] Arrival date & time: 05/03/17  2215  By signing my name below, I, Ny'Kea Lewis, attest that this documentation has been prepared under the direction and in the presence of Zadie RhineWickline, Bell Carbo, MD. Electronically Signed: Karren CobbleNy'Kea Lewis, ED Scribe. 05/04/17. 2:27 AM.  History   Chief Complaint Chief Complaint  Patient presents with  . Suicidal    IVC   The history is provided by the patient. No language interpreter was used.   HPI Comments: Charlotte Scott is a 26 y.o. female with a history of anxiety and depression, who presents to the Emergency Department for evaluation after suicidal ideation. PT reports she was threatening to take her life with a gun. Per nurse's notes pt broke up with her boyfriend and this caused the following events to occur. She was escorted by police and here for IVC. She denies taken pills or self mutilation. No physical complaints noted at this time.   Past Medical History:  Diagnosis Date  . Anxiety   . Depression    There are no active problems to display for this patient.  Past Surgical History:  Procedure Laterality Date  . NASAL SINUS SURGERY     OB History    Gravida Para Term Preterm AB Living   2       2 0   SAB TAB Ectopic Multiple Live Births                 Home Medications    Prior to Admission medications   Medication Sig Start Date End Date Taking? Authorizing Provider  escitalopram (LEXAPRO) 10 MG tablet Take 10 mg by mouth daily.   Yes [provider]  PRESCRIPTION MEDICATION Take 1 capsule by mouth 2 (two) times daily.   Yes [provider]  norgestimate-ethinyl estradiol (ORTHO-CYCLEN,SPRINTEC,PREVIFEM) 0.25-35 MG-MCG tablet Take 1 tablet by mouth daily. Patient not taking: Reported on 02/06/2017 08/16/16   Rasch, Victorino DikeJennifer I, NP  sertraline (ZOLOFT) 50 MG tablet Take 1 tablet (50 mg total) by mouth daily. Patient not taking: Reported on 05/04/2017 04/19/17   Peyton NajjarHopper,  David H, MD   Family History Family History  Problem Relation Age of Onset  . Cancer Maternal Aunt   . Cancer Paternal Grandfather    Social History Social History  Substance Use Topics  . Smoking status: Never Smoker  . Smokeless tobacco: Never Used  . Alcohol use No   Allergies   Patient has no known allergies.  Review of Systems Review of Systems  Psychiatric/Behavioral: Positive for suicidal ideas.  All other systems reviewed and are negative.    Physical Exam Updated Vital Signs BP 105/72 (BP Location: Left Arm)   Pulse 83   Temp 98.3 F (36.8 C) (Oral)   Resp 16   LMP 05/03/2017   SpO2 100%   Physical Exam CONSTITUTIONAL: Well developed/well nourished HEAD: Normocephalic/atraumatic EYES: EOMI ENMT: Mucous membranes moist NECK: supple no meningeal signs SPINE/BACK:entire spine nontender CV: S1/S2 noted, no murmurs/rubs/gallops noted LUNGS: Lungs are clear to auscultation bilaterally, no apparent distress ABDOMEN: soft, nontender, no rebound or guarding, bowel sounds noted throughout abdomen NEURO: Pt is awake/alert/appropriate, moves all extremitiesx4.  No facial droop.   EXTREMITIES: pulses normal/equal, full ROM SKIN: warm, color normal PSYCH: no abnormalities of mood noted, alert and oriented to situation   ED Treatments / Results  DIAGNOSTIC STUDIES: Oxygen Saturation is 100% on RA, normal by my interpretation.   COORDINATION OF CARE: 2:17 AM-Discussed next  steps with pt. Pt verbalized understanding and is agreeable with the plan.   Labs (all labs ordered are listed, but only abnormal results are displayed) Labs Reviewed  COMPREHENSIVE METABOLIC PANEL - Abnormal; Notable for the following:       Result Value   Glucose, Bld 128 (*)    ALT 11 (*)    All other components within normal limits  RAPID URINE DRUG SCREEN, HOSP PERFORMED - Abnormal; Notable for the following:    Tetrahydrocannabinol POSITIVE (*)    All other components within  normal limits  ETHANOL  CBC WITH DIFFERENTIAL/PLATELET  I-STAT BETA HCG BLOOD, ED ([facility], [facility], [facility] ONLY)  I-STAT BETA HCG BLOOD, ED ([facility], [facility], [facility] ONLY)    EKG  EKG Interpretation None      Radiology No results found.  Procedures Procedures (including critical care time)  Medications Ordered in ED Medications - No data to display  Initial Impression / Assessment and Plan / ED Course  I have reviewed the triage vital signs and the nursing notes.  Pertinent labs  results that were available during my care of the patient were reviewed by me and considered in my medical decision making (see chart for details).     Pt medically stable Will need psych admit   Final Clinical Impressions(s) / ED Diagnoses   Final diagnoses:  Suicidal ideation    New Prescriptions New Prescriptions   No medications on file  I personally performed the services described in this documentation, which was scribed in my presence. The recorded information has been reviewed and is accurate.        Zadie Rhine, MD 05/04/17 [id number]

## 2017-05-04 NOTE — ED Notes (Signed)
No answer when attempting to call report.  Will try again later.

## 2017-05-04 NOTE — [facility] Counselor (Addendum)
Clinician expressed to Charlotte NeedleAtilade, RN pt's dispostion (inpatient treatment) as she was assessed previously. [facility]. Disposition discussed with Dr. Bebe ShaggyWickline.    Redmond Pullingreylese D Ruthann Angulo, MS, [facility], [facility] Triage Specialist [phone]

## 2017-05-04 NOTE — ED Notes (Signed)
Inpatient recommendation per TTS

## 2017-05-04 NOTE — Tx Team (Signed)
Initial Treatment Plan 05/04/2017 8:22 PM Charlotte Scott YQM:[id number]    PATIENT STRESSORS: Financial difficulties Loss of relationship with boyfriend Substance abuse   PATIENT STRENGTHS: Wellsite geologistCommunication skills General fund of knowledge Physical Health   PATIENT IDENTIFIED PROBLEMS: Depression  Suicidal ideation  "Handling my emotions better"  "Be able to tolerate other's better"               DISCHARGE CRITERIA:  Improved stabilization in mood, thinking, and/or behavior Verbal commitment to aftercare and medication compliance  PRELIMINARY DISCHARGE PLAN: Outpatient therapy Medication management  PATIENT/FAMILY INVOLVEMENT: This treatment plan has been presented to and reviewed with the patient, Charlotte Scott.  The patient and family have been given the opportunity to ask questions and make suggestions.  Levin BaconHeather V Willona Phariss, RN 05/04/2017, 8:22 PM

## 2017-05-04 NOTE — ED Notes (Signed)
Patient continues to be anxious alternating between periods of crying and asking sitter over and over when is she leaving this unit.  NP notified and ativan po ordered.

## 2017-05-05 DIAGNOSIS — F341 Dysthymic disorder: Secondary | ICD-10-CM

## 2017-05-05 DIAGNOSIS — F332 Major depressive disorder, recurrent severe without psychotic features: Principal | ICD-10-CM

## 2017-05-05 DIAGNOSIS — F4320 Adjustment disorder, unspecified: Secondary | ICD-10-CM

## 2017-05-05 MED ORDER — SERTRALINE HCL 25 MG PO TABS
25.0000 mg | ORAL_TABLET | Freq: Every day | ORAL | Status: DC
Start: 2017-05-05 — End: 2017-05-06
  Administered 2017-05-05 – 2017-05-06 (×2): 25 mg via ORAL
  Filled 2017-05-05 (×5): qty 1

## 2017-05-05 MED ORDER — TRAZODONE HCL 50 MG PO TABS
50.0000 mg | ORAL_TABLET | Freq: Every evening | ORAL | Status: DC | PRN
  Administered 2017-05-05 – 2017-05-06 (×2): 50 mg via ORAL
  Filled 2017-05-05 (×7): qty 1

## 2017-05-05 NOTE — [facility] Group Notes (Signed)
  [facility] LCSW Group Therapy Note  05/05/2017  and  10:00 - 11:00 AM  Type of Therapy and Topic:  Group Therapy: Avoiding Self-Sabotaging and Enabling Behaviors  Participation Level:  Active  Participation Quality:  Appropriate  Affect:  Anxious  Cognitive:  Alert and Oriented  Insight:  Developing  Engagement in Therapy:  Developing   Therapeutic models used: Cognitive Behavioral Therapy,  Person-Centered Therapy and Motivational Interviewing  Modes of Intervention:  Discussion, Exploration, Orientation, Rapport Building, Socialization and Support   Summary of Progress/Problems:  The main focus of today's process group was for the patient to identify ways in which they have in the past sabotaged their own recovery. Motivational Interviewing was utilized to identify motivation they may have for wanting to change. The Stages of Change were explained using a handout, and patients identified where they currently are with regard to stages of change. Patient shared her tendency to focus solely on romantic relationship and acknowledges need for self care.    Carney Bernatherine C Harrill, LCSW

## 2017-05-05 NOTE — Progress Notes (Signed)
Patient ID: Charlotte Scott, female   DOB: April 24, 1991, 24 y.o.   MRN: [id number]   D: Patient got up briefly earlier and went to group. After group patient went to bed. Attempted to wake up to assess how she was and if she needed anything but sleeping hard even after calling name so undersigned let her sleep. No bedtime medication ordered at this time. No issues on the unit. A: Staff will monitor on q 15 minute checks, follow treatment plan, and give medications as ordered. R: Cooperative on the unit

## 2017-05-05 NOTE — [facility] Suicide Risk Assessment (Signed)
[facility] Admission Suicide Risk Assessment   Nursing information obtained from:  Patient Demographic factors:  Adolescent or young adult Current Mental Status:  NA Loss Factors:  Loss of significant relationship, Financial problems / change in socioeconomic status Historical Factors:  NA Risk Reduction Factors:  NA  Total Time spent with patient: 45 minutes Principal Problem: <principal problem not specified> Diagnosis:   Patient Active Problem List   Diagnosis Date Noted  . 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 Background history of depression. Presented in company of her parents. Was involuntarily committed by them. Parents reported worsening depression in the past couple of months. She is not motivated to do things. Feels drained most of the time. Crying spells, in bed most of the time,  has been struggling to function at home and at work. Has been having increased thoughts of suicide. Has had thoughts to shoot herself, curt herself or run into traffic. UDS was positive for THC. Other laboratory parameters are within normal limits.  Patient is clinically depressed. She has no past suicidal behavior. No associated psychosis. No associated mania. No overwhelming anxiety. No preoccupation with violent thoughts. No substance use disorder. No access to weapons. Depression was precipitated by recent breakup. She is financially stable. She has good family support. She has a stable living environment. She has agreed to start on antidepressant medication  Continued Clinical Symptoms:  Alcohol Use Disorder Identification Test Final Score (AUDIT): 0 The "Alcohol Use Disorders Identification Test", Guidelines for Use in Primary Care, Second Edition.  World Science writer [facility]). Score between 0-7:  no or low risk or alcohol related problems. Score between 8-15:  moderate risk of  alcohol related problems. Score between 16-19:  high risk of alcohol related problems. Score 20 or above:  warrants further diagnostic evaluation for alcohol dependence and treatment.   CLINICAL FACTORS:   Dysthymia Depression Adjustment disorder  Musculoskeletal: Strength & Muscle Tone: within normal limits Gait & Station: normal Patient leans: N/A  Psychiatric Specialty Exam: Physical Exam  ROS  Blood pressure 109/75, pulse 95, temperature 98.6 F (37 C), temperature source Oral, resp. rate 16, height 5\' 6"  (1.676 m), weight 56.9 kg (125 lb 8 oz), last menstrual period 05/03/2017, SpO2 100 %, unknown if currently breastfeeding.Body mass index is 20.26 kg/m.  General Appearance: As in H&P  Eye Contact:  Good  Speech:  As in H&P  Volume:  As in H&P  Mood:  Depressed  Affect:  As in H&P  Thought Process:  Linear  Orientation:  Full (Time, Place, and Person)  Thought Content:  As in H&P  Suicidal Thoughts:  As in H&P  Homicidal Thoughts:  No  Memory:  As in H&P  Judgement:  As in H&P  Insight:  Good  Psychomotor Activity:  Decreased  Concentration:  As in H&P  Recall:  As in H&P  Fund of Knowledge:  As in H&P  Language:  Good  Akathisia:  NA  Handed:    AIMS (if indicated):     Assets:  As in H&P  ADL's:  Intact  Cognition:  WNL  Sleep:         COGNITIVE FEATURES THAT CONTRIBUTE TO RISK:  None    SUICIDE RISK:   Moderate:  Frequent suicidal ideation with limited intensity, and duration, some specificity in terms of plans, no associated intent, good self-control, limited dysphoria/symptomatology, some risk factors present,  and identifiable protective factors, including available and accessible social support.  PLAN OF CARE:  As in H&P  I certify that inpatient services furnished can reasonably be expected to improve the patient's condition.   Georgiann CockerVincent A Izediuno, MD 05/05/2017, 10:51 AM

## 2017-05-05 NOTE — [facility] Group Notes (Signed)
Group Progress Note  Nursing Psychoeducational Group  [id number]  This group included a 20 minute long TedTalk entitled "The Power of Vulnerability" by Dewain PenningBrene Brown.  After introducing themselves the patients watched the video, then we had an open discussion about it for 10 minutes.  Afterwards the patients listed educational topics while the nurse taught on them.  Among these topics included Bipolar, depression, addiction and drug dependence  The patient attended for the duration of the group and participated well
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
## 2017-05-05 NOTE — [facility] Suicide Risk Assessment (Signed)
[facility] INPATIENT:  Family/Significant Other Suicide Prevention Education  Suicide Prevention Education:  Education Completed; Charlotte Scott (stepmother) at [phone] has been identified by the patient as the family member/significant other with whom the patient will be residing, and identified as the person(s) who will aid the patient in the event of a mental health crisis (suicidal ideations/suicide attempt).  With written consent from the patient, the family member/significant other has been provided the following suicide prevention education, prior to the and/or following the discharge of the patient.  The suicide prevention education provided includes the following:  Suicide risk factors  Suicide prevention and interventions  National Suicide Hotline telephone number  [facility] assessment telephone number  [facility] Emergency Assistance 911  [facility] and/or Residential Mobile Crisis Unit telephone number  Request made of family/significant other to:  Remove weapons (e.g., guns, rifles, knives), all items previously/currently identified as safety concern.    Remove drugs/medications (over-the-counter, prescriptions, illicit drugs), all items previously/currently identified as a safety concern.  The family member/significant other verbalizes understanding of the suicide prevention education information provided.  The family member/significant other agrees to remove the items of safety concern listed above.  Step Mother assures CSW that firearm has been secured in lock box and patient has no idea where it is in the home. Step mother concerned re patient; see progress note of 05/05/2017    Charlotte Scott 05/05/2017, 5:09 PM

## 2017-05-05 NOTE — H&P (Signed)
Psychiatric Admission Assessment Adult  Patient Identification: Charlotte Scott MRN:  [id number] Date of Evaluation:  05/05/2017 Chief Complaint:  MDD Principal Diagnosis: <principal problem not specified> Diagnosis:   Patient Active Problem List   Diagnosis Date Noted  . Major depressive disorder, recurrent severe without psychotic features ([place]) [F33.2] 05/04/2017   History of Present Illness:   26 yo AAF, single, self employed lives alone. Background history of depression. Presented in company of her parents. Was involuntarily committed by them. Parents reported worsening depression in the past couple of months. She is not motivated to do things. Feels drained most of the time. Crying spells, in bed most of the time,  has been struggling to function at home and at work. Has been having increased thoughts of suicide. Has had thoughts to shoot herself, curt herself or run into traffic. UDS was positive for THC. Other laboratory parameters are within normal limits.   At interview, patient states that she has had depression since she was a child. She had never required treatment until recently. Says her relationship of two and half years came to an end a week ago. She has been feeling more depressed since then. Says she has lost motivation to do things. She admits having fleeting suicidal thoughts. Says she has no intention to harm self. She was prescribed Lexapro three days ago. She has taken it once. Patient reports difficulty getting into sleep. Says she wakes up once but easily gets back to sleep. She has never been a fan of food. She is eating less lately. Associated weight loss. Patient ruminates a lot on her relationship that just ended. She denies any other stressors. No legal issues. 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 Says it has been going really well. Patient says she hopes to get better and get back to work. Describes self as an introvert. Says she typically works most of the time.  She does not drink alcohol. She admits occassional use of THC. Denies use of any other drugs. No associated hallucinations. No delusional preoccupation. No evidence of mania. No overwhelming anxiety. No homicidal thoughts. No thoughts of violence. No access to weapons.    Total Time spent with patient: 1 hour  Past Psychiatric History: Persistent Depressive Disorder. Has never required treatment. No past manic or hypomanic episodes. No past hospitalization. No contact with child psychiatry. No past suicidal behavior. No past violent behavior.   Is the patient at risk to self? Yes.    Has the patient been a risk to self in the past 6 months? No.  Has the patient been a risk to self within the distant past? No.  Is the patient a risk to others? No.  Has the patient been a risk to others in the past 6 months? No.  Has the patient been a risk to others within the distant past? No.   Prior Inpatient Therapy:   Prior Outpatient Therapy:    Alcohol Screening: 1. How often do you have a drink containing alcohol?: Never 9. Have you or someone else been injured as a result of your drinking?: No 10. Has a relative or friend or a doctor or another health worker been concerned about your drinking or suggested you cut down?: No Alcohol Use Disorder Identification Test Final Score (AUDIT): 0 Brief Intervention: AUDIT score less than 7 or less-screening does not suggest unhealthy drinking-brief intervention not indicated Substance Abuse History in the last 12 months:  Yes.   Consequences of Substance Abuse: None Previous  Psychotropic Medications: No  Psychological Evaluations: No  Past Medical History:  Past Medical History:  Diagnosis Date  . Anxiety   . Depression     Past Surgical History:  Procedure Laterality Date  . NASAL SINUS SURGERY     Family History:  Family History  Problem Relation Age of Onset  . Cancer Maternal Aunt   . Cancer Paternal Grandfather    Family Psychiatric   History: No family history of mental illness. No family history of suicide.  Tobacco Screening: Have you used any form of tobacco in the last 30 days? (Cigarettes, Smokeless Tobacco, Cigars, and/or Pipes): No Social History:  History  Alcohol Use No     History  Drug Use  . Types: Marijuana    Additional Social History: Pain Medications: see MAR Prescriptions: see MAR Over the Counter: see MAR History of alcohol / drug use?: Yes Name of Substance 1: cannabis 1 - Age of First Use: UTA 1 - Amount (size/oz): 3 blunts 1 - Frequency: daily 1 - Duration: a long time 1 - Last Use / Amount: Unknown   Patient was born and raised locally in [place]. She was brought up by her biological parents. She was well adjusted as a child. She has two years of college. 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 No kids. No past legal issues. Good support from her family. She is the oldest of three siblings. Christianity  by faith. No military experience.    Allergies:  No Known Allergies Lab Results:  Results for orders placed or performed during the hospital encounter of 05/03/17 (from the past 48 hour(s))  Comprehensive metabolic panel     Status: Abnormal   Collection Time: 05/03/17 11:01 PM  Result Value Ref Range   Sodium 137 135 - 145 mmol/L   Potassium 3.5 3.5 - 5.1 mmol/L   Chloride 102 101 - 111 mmol/L   CO2 25 22 - 32 mmol/L   Glucose, Bld 128 (H) 65 - 99 mg/dL   BUN 12 6 - 20 mg/dL   Creatinine, Ser 0.87 0.44 - 1.00 mg/dL   Calcium 9.3 8.9 - 10.3 mg/dL   Total Protein 7.9 6.5 - 8.1 g/dL   Albumin 4.7 3.5 - 5.0 g/dL   AST 19 15 - 41 U/L   ALT 11 (L) 14 - 54 U/L   Alkaline Phosphatase 39 38 - 126 U/L   Total Bilirubin 0.7 0.3 - 1.2 mg/dL   GFR calc non Af Amer >60 >60 mL/min   GFR calc Af Amer >60 >60 mL/min    Comment: (NOTE) The eGFR has been calculated using the CKD EPI equation. This calculation has not been validated in all clinical situations. eGFR's persistently <60 mL/min  signify possible Chronic Kidney Disease.    Anion gap 10 5 - 15  Ethanol     Status: None   Collection Time: 05/03/17 11:01 PM  Result Value Ref Range   Alcohol, Ethyl (B) <5 <5 mg/dL    Comment:        LOWEST DETECTABLE LIMIT FOR SERUM ALCOHOL IS 5 mg/dL FOR MEDICAL PURPOSES ONLY   CBC with Diff     Status: None   Collection Time: 05/03/17 11:01 PM  Result Value Ref Range   WBC 6.7 4.0 - 10.5 K/uL   RBC 4.21 3.87 - 5.11 MIL/uL   Hemoglobin 12.3 12.0 - 15.0 g/dL   HCT 36.4 36.0 - 46.0 %   MCV 86.5 78.0 - 100.0 fL   MCH  29.2 26.0 - 34.0 pg   MCHC 33.8 30.0 - 36.0 g/dL   RDW 12.0 11.5 - 15.5 %   Platelets 230 150 - 400 K/uL   Neutrophils Relative % 53 %   Neutro Abs 3.6 1.7 - 7.7 K/uL   Lymphocytes Relative 35 %   Lymphs Abs 2.4 0.7 - 4.0 K/uL   Monocytes Relative 10 %   Monocytes Absolute 0.6 0.1 - 1.0 K/uL   Eosinophils Relative 1 %   Eosinophils Absolute 0.1 0.0 - 0.7 K/uL   Basophils Relative 1 %   Basophils Absolute 0.0 0.0 - 0.1 K/uL  I-Stat beta hCG blood, ED     Status: None   Collection Time: 05/03/17 11:13 PM  Result Value Ref Range   I-stat hCG, quantitative <5.0 <5 mIU/mL   Comment 3            Comment:   GEST. AGE      CONC.  (mIU/mL)   <=1 WEEK        5 - 50     2 WEEKS       50 - 500     3 WEEKS       100 - 10,000     4 WEEKS     1,000 - 30,000        FEMALE AND NON-PREGNANT FEMALE:     LESS THAN 5 mIU/mL   Urine rapid drug screen (hosp performed)     Status: Abnormal   Collection Time: 05/04/17 12:13 AM  Result Value Ref Range   Opiates NONE DETECTED NONE DETECTED   Cocaine NONE DETECTED NONE DETECTED   Benzodiazepines NONE DETECTED NONE DETECTED   Amphetamines NONE DETECTED NONE DETECTED   Tetrahydrocannabinol POSITIVE (A) NONE DETECTED   Barbiturates NONE DETECTED NONE DETECTED    Comment:        DRUG SCREEN FOR MEDICAL PURPOSES ONLY.  IF CONFIRMATION IS NEEDED FOR ANY PURPOSE, NOTIFY LAB WITHIN 5 DAYS.        LOWEST DETECTABLE LIMITS FOR  URINE DRUG SCREEN Drug Class       Cutoff (ng/mL) Amphetamine      1000 Barbiturate      200 Benzodiazepine   431 Tricyclics       540 Opiates          300 Cocaine          300 THC              50     Blood Alcohol level:  Lab Results  Component Value Date   ETH <5 08/67/6195    Metabolic Disorder Labs:  No results found for: HGBA1C, MPG No results found for: PROLACTIN No results found for: CHOL, TRIG, HDL, CHOLHDL, VLDL, LDLCALC  Current Medications: Current Facility-Administered Medications  Medication Dose Route Frequency Provider Last Rate Last Dose  . acetaminophen (TYLENOL) tablet 650 mg  650 mg Oral Q6H PRN Patrecia Pour, NP      . alum & mag hydroxide-simeth (MAALOX/MYLANTA) 200-200-20 MG/5ML suspension 30 mL  30 mL Oral Q4H PRN Patrecia Pour, NP      . ibuprofen (ADVIL,MOTRIN) tablet 600 mg  600 mg Oral Q8H PRN Patrecia Pour, NP      . magnesium hydroxide (MILK OF MAGNESIA) suspension 30 mL  30 mL Oral Daily PRN Patrecia Pour, NP      . ondansetron [facility]) tablet 4 mg  4 mg Oral Q8H PRN Patrecia Pour, NP  PTA Medications: Prescriptions Prior to Admission  Medication Sig Dispense Refill Last Dose  . escitalopram (LEXAPRO) 10 MG tablet Take 10 mg by mouth daily.   Past Week at Unknown time  . norgestimate-ethinyl estradiol (ORTHO-CYCLEN,SPRINTEC,PREVIFEM) 0.25-35 MG-MCG tablet Take 1 tablet by mouth daily. (Patient not taking: Reported on 02/06/2017) 1 Package 4 Not Taking  . PRESCRIPTION MEDICATION Take 1 capsule by mouth 2 (two) times daily.   05/03/2017 at Unknown time  . sertraline (ZOLOFT) 50 MG tablet Take 1 tablet (50 mg total) by mouth daily. (Patient not taking: Reported on 05/04/2017) 30 tablet 2 Not Taking at Unknown time    Musculoskeletal: Strength & Muscle Tone: within normal limits Gait & Station: normal Patient leans: N/A  Psychiatric Specialty Exam: Physical Exam  Constitutional: She is oriented to person, place, and time. No  distress.  HENT:  Head: Normocephalic.  Eyes: Pupils are equal, round, and reactive to light. Conjunctivae are normal.  Neck: Normal range of motion.  Cardiovascular: Normal rate.   Respiratory: Effort normal.  Neurological: She is alert and oriented to person, place, and time.  Skin: Skin is warm and dry. She is not diaphoretic.  Psychiatric:  As above    ROS  Blood pressure 109/75, pulse 95, temperature 98.6 F (37 C), temperature source Oral, resp. rate 16, height '5\' 6"'$  (1.676 m), weight 56.9 kg (125 lb 8 oz), last menstrual period 05/03/2017, SpO2 100 %, unknown if currently breastfeeding.Body mass index is 20.26 kg/m.  General Appearance:  In hospital clothing, slim build. Was in bed just before interview. Withdrawn. Warmed up as interview progressed. Appropriate behavior.   Eye Contact:  Good  Speech:  Decrease tone and rate  Volume:  Soft spoken  Mood:  Depressed  Affect:  Blunted and mood congruent  Thought Process:  Linear  Orientation:  Full (Time, Place, and Person)  Thought Content:  Negative ruminations about her relationship. No delusional theme. No violent thoughts. No hallucination in any modality.   Suicidal Thoughts:  Was having the thoughts after the break up. Says they are getting less. No intent to act.   Homicidal Thoughts:  No  Memory:  Immediate;   Fair Recent;   Fair Remote;   Fair  Judgement:  Fair  Insight:  Good  Psychomotor Activity:  Decreased  Concentration:  Concentration: Fair and Attention Span: Fair  Recall:  [employer] of Knowledge:  Good  Language:  Good  Akathisia:  NA  Handed:    AIMS (if indicated):     Assets:  Communication Skills Desire for Improvement Financial Resources/Insurance Housing Physical Health Resilience Transportation Vocational/Educational  ADL's:  Intact  Cognition:  WNL  Sleep:       Treatment Plan Summary: Patient suffers drom persistent depressive disorder. She had become depressed following a recent  break up with her boyfriend. She has not been able to function and she has been having suicidal thoughts. Patient is medication naive. We discussed use of Sertraline. Patient consented to treatment after we reviewed the risks and benefits.   Psychiatric: MDD single episode PDD Adjustment Disorder  Medical:  Psychosocial:  Recent breakup   PLAN: 1. Sertraline 25 mg daily. Would titrate as tolerated /needed. 2. SW would obtain collateral from her parents.  3. Encourage unit groups and activities 4. Monitor mood, behavior and interaction with peers 5. Allow voluntary status.    Observation Level/Precautions:  15 minute checks  Laboratory:    Psychotherapy:    Medications:    Consultations:  Discharge Concerns:    Estimated LOS: 3-5 days  Other:     Physician Treatment Plan for Primary Diagnosis: <principal problem not specified> Long Term Goal(s): Improvement in symptoms so as ready for discharge  Short Term Goals: Ability to identify changes in lifestyle to reduce recurrence of condition will improve, Ability to verbalize feelings will improve, Ability to disclose and discuss suicidal ideas, Ability to demonstrate self-control will improve, Ability to identify and develop effective coping behaviors will improve, Ability to maintain clinical measurements within normal limits will improve, Compliance with prescribed medications will improve and Ability to identify triggers associated with substance abuse/mental health issues will improve  Physician Treatment Plan for Secondary Diagnosis: Active Problems:   Major depressive disorder, recurrent severe without psychotic features ([place])  Long Term Goal(s): Improvement in symptoms so as ready for discharge  Short Term Goals: Ability to identify changes in lifestyle to reduce recurrence of condition will improve, Ability to verbalize feelings will improve, Ability to disclose and discuss suicidal ideas, Ability to demonstrate  self-control will improve, Ability to identify and develop effective coping behaviors will improve, Ability to maintain clinical measurements within normal limits will improve, Compliance with prescribed medications will improve and Ability to identify triggers associated with substance abuse/mental health issues will improve  I certify that inpatient services furnished can reasonably be expected to improve the patient's condition.    [place], MD 7/21/201810:00 AM
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
## 2017-05-05 NOTE — Progress Notes (Signed)
Patient did attend the evening speaker AA meeting.  

## 2017-05-05 NOTE — Progress Notes (Signed)
D.  Pt pleasant on approach, denies complaints at this time.  Positive for evening wrap up group, observed interacting appropriately with peers on the unit.  Pt denies need for prn for sleep, states sleeping well.  Pt hopeful for discharge soon.  A.  Support and encouragement offered, medication given as ordered  R. Pt remains safe on the unit, will continue to monitor.

## 2017-05-05 NOTE — Progress Notes (Signed)
Nursing Note 05/05/2017 [id number]  Data Reports sleeping good without PRN sleep med.  Rates depression 3/10, hopelessness 3/10, and anxiety 3/10. Affect apprirate.  Denies HI, SI, AVH.  Attending groups.  Changed from IVC to voluntary today. Engaged in milieu and in groups.   Action Spoke with patient 1:1, nurse offered support to patient throughout shift.  Continues to be monitored on 15 minute checks for safety.  Response Remains safe and appropriate on unit.

## 2017-05-06 MED ORDER — SERTRALINE HCL 50 MG PO TABS
50.0000 mg | ORAL_TABLET | Freq: Every day | ORAL | Status: DC
  Administered 2017-05-07: 50 mg via ORAL
  Filled 2017-05-06 (×2): qty 1

## 2017-05-06 MED ORDER — SERTRALINE HCL 25 MG PO TABS
25.0000 mg | ORAL_TABLET | Freq: Once | ORAL | Status: AC
  Administered 2017-05-06: 25 mg via ORAL
  Filled 2017-05-06 (×2): qty 1

## 2017-05-06 NOTE — Progress Notes (Signed)
D.  Pt pleasant on approach, denies complaints at this time.   Pt was positive for evening  Group, observed interacting appropriately with peers on the unit.  Pt is hopeful for discharge tomorrow.  Pt denies SI/HI/AVH at this time.  A.  Support and encouragement offered, medication given as ordered  R.  Pt remains safe on the unit, will continue to monitor.

## 2017-05-06 NOTE — Progress Notes (Signed)
Patient did attend the evening speaker AA meeting.  

## 2017-05-06 NOTE — [facility] Counselor (Signed)
[facility] LCSW Group Therapy  05/06/2017 11:15 AM - 12:05 PM  Type of Therapy:  Group Therapy  Participation Level:  Did Not Attend; invited to participate yet did not despite overhead announcement and encouragement by staff  Summary of Progress/Problems: Topic for today was thoughts and feelings regarding discharge. We discussed fears of upcoming changes including judgements, expectations and stigma of mental health issues. We then discussed supports: what constitutes a supportive framework, identification of supports and what to do when others are not supportive. Patients then identified a specific coping tool to use when others are not available.     Catherine C Harrill, LCSW     

## 2017-05-06 NOTE — Progress Notes (Signed)
DAR NOTE: Patient presents with anxious affect and depressed mood. Pt observed on the phone crying, when asked if she was okay, pt replied yes. Denies pain, auditory and visual hallucinations.  Rates depression at 0, hopelessness at 0, and anxiety at 0.  Maintained on routine safety checks.  Medications given as prescribed.  Support and encouragement offered as needed.  Attended group and participated.  Patient observed socializing with peers in the dayroom.  Offered no complaint.
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
## 2017-05-07 MED ORDER — SERTRALINE HCL 50 MG PO TABS: 50.0000 mg | ORAL_TABLET | Freq: Every day | ORAL | 0 refills | Status: AC

## 2017-05-07 MED ORDER — TRAZODONE HCL 50 MG PO TABS: 50.0000 mg | ORAL_TABLET | Freq: Every evening | ORAL | 0 refills | Status: DC | PRN

## 2017-05-07 NOTE — Progress Notes (Signed)
  [facility] Discharge [facility] :  Will you be returning to the same living situation after discharge:  Yes,  home with father and stepmather At discharge, do you have transportation home?: Yes,  family member Do you have the ability to pay for your medications: Yes,  [employer] private insurance  Release of information consent forms completed and submitted to medical records by CSW.  Patient to Follow up at: Follow-up Information    [facility] Follow up on 05/08/2017.   Why:  Therapy appt with Deanna ArtisKeisha on Tuesday at 2:00PM. Please bring hospital discharge paperwork with you to this appt. Thank you.  Contact information: [address] [place] [address] [phone]        [facility] Follow up.   Why:  Referral submitted to new patient scheduler, Sharl MaMarty. Voicemail left for [facility] requesting appt for medication management services in order for you to continue receiving prescription medications. The office will contact you directly after your discharge Contact information: [address] [place] [address] [phone]           Next level of care provider [facility]:yes  Safety Planning and Suicide Prevention discussed: Yes,  SPE completed with pt's stepmother. SPI pamphlet andd mobile crisis information also provided.   Have you used any form of tobacco in the last 30 days? (Cigarettes, Smokeless Tobacco, Cigars, and/or Pipes): No  Has patient been referred to the Quitline?: N/A patient is not a smoker  Patient has been referred for addiction treatment: Yes  Anglea Gordner N Smart LCSW 05/07/2017, 9:45 AM

## 2017-05-07 NOTE — Discharge Summary (Signed)
Physician Discharge Summary Note  Patient:  Charlotte Scott is an 26 y.o., female MRN:  [id number] DOB:  11-Apr-1991 Patient phone:  [phone] (home)  Patient address:   [address] 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 Background history of depression. Presented in company of her parents. Was involuntarily committed by them. Parents reported worsening depression in the past couple of months. She is not motivated to do things. Feels drained most of the time. Crying spells, in bed most of the time,  has been struggling to function at home and at work. Has been having increased thoughts of suicide. Has had thoughts to shoot herself, curt herself or run into traffic. UDS was positive for THC. Other laboratory parameters are within normal limits.   At interview, patient states that she has had depression since she was a child. She had never required treatment until recently. Says her relationship of two and half years came to an end a week ago. She has been feeling more depressed since then. Says she has lost motivation to do things. She admits having fleeting suicidal thoughts. Says she has no intention to harm self. She was prescribed Lexapro three days ago. She has taken it once. Patient reports difficulty getting into sleep. Says she wakes up once but easily gets back to sleep. She has never been a fan of food. She is eating less lately. Associated weight loss. Patient ruminates a lot on her relationship that just ended. She denies any other stressors. No legal issues. 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 Says it has been going really well. Patient says she hopes to get better and get back to work. Describes self as an introvert. Says she typically works most of the time. She does not drink alcohol. She admits occassional  use of THC. Denies use of any other drugs. No associated hallucinations. No delusional preoccupation. No evidence of mania. No overwhelming anxiety. No homicidal thoughts. No thoughts of violence. No access to weapons.   Principal Problem: Major depressive disorder, recurrent severe without psychotic features [facility]) Discharge Diagnoses: Patient Active Problem List   Diagnosis Date Noted  . 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 Adjustment disorder [F43.20] 05/05/2017  . Major depressive disorder, recurrent severe without psychotic features (HCC) [F33.2] 05/04/2017    Past Psychiatric History:   Past Medical History:  Past Medical History:  Diagnosis Date  . Anxiety   . Depression     Past Surgical History:  Procedure Laterality Date  . NASAL SINUS SURGERY     Family History:  Family History  Problem Relation Age of Onset  . Cancer Maternal Aunt   . Cancer Paternal Grandfather    Family Psychiatric  History:  Social History:  History  Alcohol Use No     History  Drug Use  . Types: Marijuana    Social History   Social History  . Marital status: Single    Spouse name: N/A  . Number of children: N/A  . Years of education: N/A   Social History Main Topics  . Smoking status: Never Smoker  . Smokeless tobacco: Never Used  . Alcohol use No  . Drug use: Yes    Types: Marijuana  . Sexual activity: Yes    Birth control/ protection: None   Other Topics Concern  . None  Social History Narrative  . None    Hospital Course: Kyra MangesJaraiesha O Vaughn was admitted for Major depressive disorder, recurrent severe without psychotic features (HCC) and crisis management.  Pt was treated discharged with the medications listed below under Medication List.  Medical problems were identified and treated as needed.  Home medications were restarted as appropriate.  Improvement was monitored by observation and Kyra MangesJaraiesha O Vandruff 's daily report of symptom reduction.  Emotional  and mental status was monitored by daily self-inventory reports completed by Kyra MangesJaraiesha O Ahlin and clinical staff.         Kyra MangesJaraiesha O Maden was evaluated by the treatment team for stability and plans for continued recovery upon discharge. Kyra MangesJaraiesha O Silba 's motivation was an integral factor for scheduling further treatment. Employment, transportation, bed availability, health status, family support, and any pending legal issues were also considered during hospital stay. Pt was offered further treatment options upon discharge including but not limited to Residential, Intensive Outpatient, and Outpatient treatment.  Kyra MangesJaraiesha O Riddell will follow up with the services as listed below under Follow Up Information.     Upon completion of this admission the patient was both mentally and medically stable for discharge denying suicidal/homicidal ideation, auditory/visual/tactile hallucinations, delusional thoughts and paranoia.   Kyra MangesJaraiesha O Poehlman responded well to treatment with Zoloft 50 mg  and Trazodone 50 mg PRN without adverse effects. Pt demonstrated improvement without reported or observed adverse effects to the point of stability appropriate for outpatient management. Pertinent labs include:, for which outpatient follow-up is necessary for lab recheck as mentioned below. Reviewed CBC, CMP, BAL, and UDS + THC; all unremarkable aside from noted exceptions.   Physical Findings: AIMS: Facial and Oral Movements Muscles of Facial Expression: None, normal Lips and Perioral Area: None, normal Jaw: None, normal Tongue: None, normal,Extremity Movements Upper (arms, wrists, hands, fingers): None, normal Lower (legs, knees, ankles, toes): None, normal, Trunk Movements Neck, shoulders, hips: None, normal, Overall Severity Severity of abnormal movements (highest score from questions above): None, normal Incapacitation due to abnormal movements: None, normal Patient's awareness of abnormal movements (rate only  patient's report): No Awareness, Dental Status Current problems with teeth and/or dentures?: No Does patient usually wear dentures?: No  CIWA:    COWS:     Musculoskeletal: Strength & Muscle Tone: within normal limits Gait & Station: normal Patient leans: N/A  Psychiatric Specialty Exam: See SRA by MD Physical Exam  Vitals reviewed. Constitutional: She is oriented to person, place, and time. She appears well-developed.  HENT:  Head: Normocephalic.  Cardiovascular: Normal rate.   Neurological: She is alert and oriented to person, place, and time.  Psychiatric: She has a normal mood and affect.    Review of Systems  Psychiatric/Behavioral: Negative for depression (stable). The patient is not nervous/anxious (stable).     Blood pressure (!) 116/93, pulse (!) 104, temperature 98.9 F (37.2 C), temperature source Oral, resp. rate 16, height 5\' 6"  (1.676 m), weight 56.9 kg (125 lb 8 oz), last menstrual period 05/03/2017, SpO2 100 %, unknown if currently breastfeeding.Body mass index is 20.26 kg/m.   Have you used any form of tobacco in the last 30 days? (Cigarettes, Smokeless Tobacco, Cigars, and/or Pipes): No  Has this patient used any form of tobacco in the last 30 days? (Cigarettes, Smokeless Tobacco, Cigars, and/or Pipes)  No  Blood Alcohol level:  Lab Results  Component Value Date   ETH <5 05/03/2017    Metabolic Disorder Labs:  No results found for:  HGBA1C, MPG No results found for: PROLACTIN No results found for: CHOL, TRIG, HDL, CHOLHDL, VLDL, LDLCALC  See Psychiatric Specialty Exam and Suicide Risk Assessment completed by Attending Physician prior to discharge.  Discharge destination:  Home  Is patient on multiple antipsychotic therapies at discharge:  No   Has Patient had three or more failed trials of antipsychotic monotherapy by history:  No  Recommended Plan for Multiple Antipsychotic Therapies: NA  Discharge Instructions    Diet - low sodium heart healthy     Complete by:  As directed    Discharge instructions    Complete by:  As directed    Take all medications as prescribed. Keep all follow-up appointments as scheduled.  Do not consume alcohol or use illegal drugs while on prescription medications. Report any adverse effects from your medications to your primary care provider promptly.  In the event of recurrent symptoms or worsening symptoms, call 911, a crisis hotline, or go to the nearest emergency department for evaluation.   Increase activity slowly    Complete by:  As directed      Allergies as of 05/07/2017   No Known Allergies     Medication List    STOP taking these medications   escitalopram 10 MG tablet Commonly known as:  LEXAPRO   PRESCRIPTION MEDICATION     TAKE these medications     Indication  norgestimate-ethinyl estradiol 0.25-35 MG-MCG tablet Commonly known as:  ORTHO-CYCLEN,SPRINTEC,PREVIFEM Take 1 tablet by mouth daily.  Indication:  Birth Control Treatment   sertraline 50 MG tablet Commonly known as:  ZOLOFT Take 1 tablet (50 mg total) by mouth daily.  Indication:  Major Depressive Disorder   traZODone 50 MG tablet Commonly known as:  DESYREL Take 1 tablet (50 mg total) by mouth at bedtime and may repeat dose one time if needed.  Indication:  Trouble Sleeping        Follow-up recommendations:  Activity:  as tolerated Diet:  healthy healthy  Comments: Take all medications as prescribed. Keep all follow-up appointments as scheduled.  Do not consume alcohol or use illegal drugs while on prescription medications. Report any adverse effects from your medications to your primary care provider promptly.  In the event of recurrent symptoms or worsening symptoms, call 911, a crisis hotline, or go to the nearest emergency department for evaluation.   Signed: Oneta Rack, NP 05/07/2017, 9:27 AM

## 2017-05-07 NOTE — [facility] Suicide Risk Assessment (Signed)
[facility]   Principal Problem: Major depressive disorder, recurrent severe without psychotic features [facility](HCC) Discharge Diagnoses: Adjustment Disorder with depressed mood Patient Active Problem List   Diagnosis Date Noted  . 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 Adjustment disorder [F43.20] 05/05/2017  . Major depressive disorder, recurrent severe without psychotic features (HCC) [F33.2] 05/04/2017    Total Time spent with patient: 1 hour  Musculoskeletal: Strength & Muscle Tone: within normal limits Gait & Station: normal Patient leans: N/A  Psychiatric Specialty Exam: Review of Systems  Constitutional: Negative.   HENT: Negative.   Eyes: Negative.   Respiratory: Negative.   Cardiovascular: Negative.   Gastrointestinal: Negative.   Genitourinary: Negative.   Musculoskeletal: Negative.   Skin: Negative.   Neurological: Negative.   Endo/Heme/Allergies: Negative.   Psychiatric/Behavioral: Negative for depression, hallucinations, memory loss and suicidal ideas. The patient is not nervous/anxious and does not have insomnia.     Blood pressure (!) 116/93, pulse (!) 104, temperature 98.9 F (37.2 C), temperature source Oral, resp. rate 16, height 5\' 6"  (1.676 m), weight 56.9 kg (125 lb 8 oz), last menstrual period 05/03/2017, SpO2 100 %, unknown if currently breastfeeding.Body mass index is 20.26 kg/m.  General Appearance: Neatly dressed, pleasant, engaging well and cooperative. Appropriate behavior. Not in any distress. Good relatedness. Not internally stimulated  Eye Contact::  Good  Speech:  Spontaneous, normal prosody. Normal tone and rate.   Volume:  Normal  Mood:  Euthymic  Affect:  Appropriate and Full Range  Thought Process:  Linear  Orientation:  Full (Time, Place, and Person)  Thought Content:  Future oriented. No delusional theme. No preoccupation with violent thoughts. No negative ruminations. No obsession.  No hallucination  in any modality.   Suicidal Thoughts:  No  Homicidal Thoughts:  No  Memory:  Immediate;   Good Recent;   Good Remote;   Good  Judgement:  Good  Insight:  Good  Psychomotor Activity:  Normal  Concentration:  Good  Recall:  Good  Fund of Knowledge:Good  Language: Good  Akathisia:  Negative  Handed:    AIMS (if indicated):     Assets:  Communication Skills Desire for Improvement Financial Resources/Insurance Housing Physical Health Resilience Talents/Skills Transportation Vocational/Educational  Sleep:  Number of Hours: 6.25  Cognition: WNL  ADL's:  Intact   Clinical Assessment::   26 yo AAF, single, self employed lives alone. Background history of depression. Presented in company of her parents. Was involuntarily committed by them. Parents reportedworsening depression in the past couple of months. She is not motivated to do things. Feels drained most of the time. Crying spells, in bed most of the time, has been struggling to function at home and at work. Has been having increased thoughts of suicide. Has had thoughts to shoot herself, curt herself or run into traffic. UDS was positive for THC. Other laboratory parameters are within normal limits.  Seen today. Says she was tearful yesterday because she was not sure if she was going to be discharged. She was talking to her mom over the phone then. Says she feels good. She feels positive with her current medication. Says she is not ruminating about her loss anymore. Patient is sleeping well at night. She is eating her meals. She reports normal energy. She is able to think clearly. No evidence of psychosis. No evidence of mania. No overwhelming anxiety.    Her parents have moved all her belongings to their house. They have set up her workshop  at their dinning room. Patient says her parents have given the keys of the apartment to her ex-boyfriend. Says her parents informed her that he was not threatening and asked about how the patient was  doing. Patient does not have any thoughts of violence towards him. Patient says if she has to pay for the damages, she would over time. Patient says she is no longer having any thoughts of suicide. Says she was just very angry with him and at herself then. Says when she was looking at her parents gun, she realized she does not even know how to assemble it. Says she has never used a gun before. Her parents has secured all the weapons in the house. Patient does not have access to any weapon.  No past suicidal behavior. No substance use disorder. Reactive depression which was precipitated by recent breakup. She is financially stable. She has good family support. She would move in with her parents.  Patient does not have any access to weapons. She is tolerating her medication well. Patient plans to engage with follow up  Nursing staff reports that patient has been appropriate on the unit. She was observed to be tearful on one occassion yesterday. Patient explained what was going on as above. Patient has been interacting well with peers. No behavioral issues. Patient has not voiced any suicidal thoughts. Patient has not been observed to be internally stimulated. Patient has been adherent with treatment recommendations. Patient has been tolerating their medication well.   Patient was discussed at team. Team members feels that patient is back to her baseline level of function. Team agrees with plan to discharge patient today.  Demographic Factors:  NA  Loss Factors: Loss of significant relationship  Historical Factors: NA  Risk Reduction Factors:   Sense of responsibility to family, Employed, Living with another person, especially a relative, Positive social support, Positive therapeutic relationship and Positive coping skills or problem solving skills  Continued Clinical Symptoms:   As above  Cognitive Features That Contribute To Risk:  None    Suicide Risk:  Minimal: No identifiable suicidal  ideation.  Patient is not having any thoughts of suicide at this time. Modifiable risk factors targeted during this admission includes depression and adjustment disorder. Demographical and historical risk factors cannot be modified. Patient is now engaging well. Patient is reliable and is future oriented. We have buffered patient's support structures. At this point, patient is at low risk of suicide. Patient is aware of the effects of psychoactive substances on decision making process. Patient has been provided with emergency contacts. Patient acknowledges to use resources provided if unforseen circumstances changes their current risk stratification.    Follow-up Information    [facility] Follow up on 05/08/2017.   Why:  Therapy appt with Deanna Artis on Tuesday at 2:00PM. Please bring hospital discharge paperwork with you to this appt. Thank you.  Contact information: [address] [place] [address] [phone]        [facility] Follow up on 05/14/2017.   Why:  Evaluation with Missy Reed at 11:30 on Monday 05/14/17. Please bring insurance card. Medication management appt on 06/27/17 at 11:00AM with Sharman Crate. $20 deposit required-call office at discharge to pay prior to first appt. Thank you!  Contact information: [address] [place] [address] [phone]           Plan Of Care/Follow-up recommendations:  1. Continue current psychotropic medications 2. Mental health and addiction follow up as  arranged.  3. Discharge in care of their family 4. Provided limited quantity of prescriptions   Georgiann Cocker, MD 05/07/2017, 10:05 AM

## 2017-05-07 NOTE — Progress Notes (Signed)
Patient ID: Charlotte MangesJaraiesha O Calderone, female   DOB: 04-May-1991, 26 y.o.   MRN: [id number] Patient was discharged to home/self care in the company of her mother.  Patient was smiling and bright upon discharge.  Patient denies SI, HI and AVH.  Patient reported feeling like she was ready to be home.

## 2017-05-07 NOTE — Tx Team (Signed)
Interdisciplinary Treatment and Diagnostic Plan Update  05/07/2017 Time of Session: 0830AM Charlotte Scott MRN: [id number]  Principal Diagnosis: MDD, recurrent, severe, without psychotic features   Secondary Diagnoses: Active Problems:   Major depressive disorder, recurrent severe without psychotic features (HCC)   Persistent depressive disorder   Adjustment disorder   Current Medications:  Current Facility-Administered Medications  Medication Dose Route Frequency Provider Last Rate Last Dose  . acetaminophen (TYLENOL) tablet 650 mg  650 mg Oral Q6H PRN Charm Rings, NP      . alum & mag hydroxide-simeth (MAALOX/MYLANTA) 200-200-20 MG/5ML suspension 30 mL  30 mL Oral Q4H PRN Charm Rings, NP      . ibuprofen (ADVIL,MOTRIN) tablet 600 mg  600 mg Oral Q8H PRN Charm Rings, NP      . magnesium hydroxide (MILK OF MAGNESIA) suspension 30 mL  30 mL Oral Daily PRN Charm Rings, NP      . ondansetron [facility]) tablet 4 mg  4 mg Oral Q8H PRN Charm Rings, NP      . sertraline (ZOLOFT) tablet 50 mg  50 mg Oral Daily Izediuno, Delight Ovens, MD   50 mg at 05/07/17 0739  . traZODone (DESYREL) tablet 50 mg  50 mg Oral QHS,MR X 1 Nira Conn A, NP   50 mg at 05/06/17 2303   PTA Medications: Prescriptions Prior to Admission  Medication Sig Dispense Refill Last Dose  . escitalopram (LEXAPRO) 10 MG tablet Take 10 mg by mouth daily.   Past Week at Unknown time  . norgestimate-ethinyl estradiol (ORTHO-CYCLEN,SPRINTEC,PREVIFEM) 0.25-35 MG-MCG tablet Take 1 tablet by mouth daily. (Patient not taking: Reported on 02/06/2017) 1 Package 4 Not Taking  . PRESCRIPTION MEDICATION Take 1 capsule by mouth 2 (two) times daily.   05/03/2017 at Unknown time  . sertraline (ZOLOFT) 50 MG tablet Take 1 tablet (50 mg total) by mouth daily. (Patient not taking: Reported on 05/04/2017) 30 tablet 2 Not Taking at Unknown time    Patient Stressors: Financial difficulties Loss of relationship with  boyfriend Substance abuse  Patient Strengths: Wellsite geologist fund of knowledge Physical Health  Treatment Modalities: Medication Management, Group therapy, Case management,  1 to 1 session with clinician, Psychoeducation, Recreational therapy.   Physician Treatment Plan for Primary Diagnosis:  MDD, recurrent, severe, without psychotic features  Long Term Goal(s): Improvement in symptoms so as ready for discharge Improvement in symptoms so as ready for discharge   Short Term Goals: Ability to identify changes in lifestyle to reduce recurrence of condition will improve Ability to verbalize feelings will improve Ability to disclose and discuss suicidal ideas Ability to demonstrate self-control will improve Ability to identify and develop effective coping behaviors will improve Ability to maintain clinical measurements within normal limits will improve Compliance with prescribed medications will improve Ability to identify triggers associated with substance abuse/mental health issues will improve Ability to identify changes in lifestyle to reduce recurrence of condition will improve Ability to verbalize feelings will improve Ability to disclose and discuss suicidal ideas Ability to demonstrate self-control will improve Ability to identify and develop effective coping behaviors will improve Ability to maintain clinical measurements within normal limits will improve Compliance with prescribed medications will improve Ability to identify triggers associated with substance abuse/mental health issues will improve  Medication Management: Evaluate patient's response, side effects, and tolerance of medication regimen.  Therapeutic Interventions: 1 to 1 sessions, Unit Group sessions and Medication administration.  Evaluation of Outcomes: Adequate for Discharge  Physician  Treatment Plan for Secondary Diagnosis: Active Problems:   Major depressive disorder, recurrent severe  without psychotic features (HCC)   Persistent depressive disorder   Adjustment disorder  Long Term Goal(s): Improvement in symptoms so as ready for discharge Improvement in symptoms so as ready for discharge   Short Term Goals: Ability to identify changes in lifestyle to reduce recurrence of condition will improve Ability to verbalize feelings will improve Ability to disclose and discuss suicidal ideas Ability to demonstrate self-control will improve Ability to identify and develop effective coping behaviors will improve Ability to maintain clinical measurements within normal limits will improve Compliance with prescribed medications will improve Ability to identify triggers associated with substance abuse/mental health issues will improve Ability to identify changes in lifestyle to reduce recurrence of condition will improve Ability to verbalize feelings will improve Ability to disclose and discuss suicidal ideas Ability to demonstrate self-control will improve Ability to identify and develop effective coping behaviors will improve Ability to maintain clinical measurements within normal limits will improve Compliance with prescribed medications will improve Ability to identify triggers associated with substance abuse/mental health issues will improve     Medication Management: Evaluate patient's response, side effects, and tolerance of medication regimen.  Therapeutic Interventions: 1 to 1 sessions, Unit Group sessions and Medication administration.  Evaluation of Outcomes: Adequate for Discharge   RN Treatment Plan for Primary Diagnosis:  MDD, recurrent, severe, without psychotic features  Long Term Goal(s): Knowledge of disease and therapeutic regimen to maintain health will improve  Short Term Goals: Ability to remain free from injury will improve, Ability to verbalize feelings will improve and Ability to disclose and discuss suicidal ideas  Medication Management: RN will  administer medications as ordered by provider, will assess and evaluate patient's response and provide education to patient for prescribed medication. RN will report any adverse and/or side effects to prescribing provider.  Therapeutic Interventions: 1 on 1 counseling sessions, Psychoeducation, Medication administration, Evaluate responses to treatment, Monitor vital signs and CBGs as ordered, Perform/monitor CIWA, COWS, AIMS and Fall Risk screenings as ordered, Perform wound care treatments as ordered.  Evaluation of Outcomes: Adequate for discharge   LCSW Treatment Plan for Primary Diagnosis:  MDD, recurrent, severe, without psychotic features  Long Term Goal(s): Safe transition to appropriate next level of care at discharge, Engage patient in therapeutic group addressing interpersonal concerns.  Short Term Goals: Engage patient in aftercare planning with referrals and resources, Facilitate patient progression through stages of change regarding substance use diagnoses and concerns and Identify triggers associated with mental health/substance abuse issues  Therapeutic Interventions: Assess for all discharge needs, 1 to 1 time with Social worker, Explore available resources and support systems, Assess for adequacy in community support network, Educate family and significant other(s) on suicide prevention, Complete Psychosocial Assessment, Interpersonal group therapy.  Evaluation of Outcomes: Adequate for discharge   Progress in Treatment: Attending groups: Yes. Participating in groups: Yes. Taking medication as prescribed: Yes. Toleration medication: Yes. Family/Significant other contact made: Yes, individual(s) contacted:  pt's stepmother. SPE completed and collateral information obtained. Patient understands diagnosis: Yes. Discussing patient identified problems/goals with staff: Yes. Medical problems stabilized or resolved: Yes. Denies suicidal/homicidal ideation: Yes. Issues/concerns per  patient self-inventory: No. Other: n/a   New problem(s) identified: No, Describe:  n/a  New Short Term/Long Term Goal(s): elimination of SI thoughts; medication management for mood stabilization, development of comprehensive mental wellness/sobriety plan.   Discharge Plan or Barriers: Pt has follow-up therapy appt at [facility] with Deanna ArtisKeisha at  2pm on Tuesday. She plans to return home with stepmother and father. [facility] [place] [facility] management and has been provided with [facility] pamphlet for additional community support/resources.   Reason for Continuation of Hospitalization: none  Estimated Length of Stay: Monday, 05/07/17  Attendees: Patient: 05/07/2017 8:18 AM  Physician: Dr. Jama Flavors MD; Dr. Jackquline Berlin MD 05/07/2017 8:18 AM  Nursing: Darrel Hoover RN 05/07/2017 8:18 AM  RN Care Manager: Onnie Boer CM 05/07/2017 8:18 AM  Social Worker: Trula Slade, LCSW 05/07/2017 8:18 AM  Recreational Therapist: x 05/07/2017 8:18 AM  Other: Hillery Jacks NP 05/07/2017 8:18 AM  Other:  05/07/2017 8:18 AM  Other: 05/07/2017 8:18 AM    Scribe for Treatment Team: Ledell Peoples Smart, LCSW 05/07/2017 8:18 AM

## 2017-05-07 NOTE — Progress Notes (Signed)
Recreation Therapy Notes  Date: 05/07/2017 Time: 9:30am Location: [address] Dayroom  Group Topic: Stress Management  Goal Area(s) Addresses:  Patient will verbalize importance of using healthy stress management.  Patient will identify positive emotions associated with healthy stress management.   Behavioral Response: Engaged  Intervention: Stress Management  Activity : Deep Breathing Meditation. Recreation Therapy Intern introduced the stress management technique of meditation. Recreation Therapy Intern played a YouTube video that helped pts work on deep breathing with beach sounds in the background. Patients were to follow along as the video played to engage in the activity.  Education: Stress Management, Discharge Planning.   Education Outcome: Acknowledges edcuation  Clinical Observations/Feedback: Pt attended group.  Marvell Fullerachel Meyer, Recreation Therapy Intern  Caroll RancherMarjette Jearlean Demauro, LRT/CTRS        Marvell FullerRachel Meyer 05/07/2017 12:39 PM

## 2017-09-05 ENCOUNTER — Observation Stay (HOSPITAL_COMMUNITY)
Admission: EM | Admit: 2017-09-05 | Discharge: 2017-09-06 | Disposition: A | Discharge status: Home / Self Care | Payer: BLUE CROSS/BLUE SHIELD | Attending: Family Medicine | Admitting: Family Medicine

## 2017-09-05 ENCOUNTER — Encounter (HOSPITAL_COMMUNITY): Payer: Self-pay | Admitting: Emergency Medicine

## 2017-09-05 ENCOUNTER — Emergency Department (HOSPITAL_COMMUNITY): Payer: BLUE CROSS/BLUE SHIELD

## 2017-09-05 DIAGNOSIS — F419 Anxiety disorder, unspecified: Secondary | ICD-10-CM | POA: Diagnosis not present

## 2017-09-05 DIAGNOSIS — Z79899 Other long term (current) drug therapy: Secondary | ICD-10-CM | POA: Insufficient documentation

## 2017-09-05 DIAGNOSIS — R112 Nausea with vomiting, unspecified: Secondary | ICD-10-CM | POA: Diagnosis present

## 2017-09-05 DIAGNOSIS — F329 Major depressive disorder, single episode, unspecified: Secondary | ICD-10-CM | POA: Diagnosis not present

## 2017-09-05 DIAGNOSIS — E860 Dehydration: Secondary | ICD-10-CM | POA: Diagnosis not present

## 2017-09-05 DIAGNOSIS — F12988 Cannabis use, unspecified with other cannabis-induced disorder: Principal | ICD-10-CM | POA: Insufficient documentation

## 2017-09-05 LAB — URINALYSIS, ROUTINE W REFLEX MICROSCOPIC
Bilirubin Urine: NEGATIVE
Glucose, UA: NEGATIVE mg/dL
Hgb urine dipstick: NEGATIVE
KETONES UR: 80 mg/dL — AB
Leukocytes, UA: NEGATIVE
Nitrite: NEGATIVE
PROTEIN: 30 mg/dL — AB
Specific Gravity, Urine: 1.018 (ref 1.005–1.030)
pH: 9 — ABNORMAL HIGH (ref 5.0–8.0)

## 2017-09-05 LAB — CBC WITH DIFFERENTIAL/PLATELET
BASOS ABS: 0 10*3/uL (ref 0.0–0.1)
Basophils Relative: 0 %
Eosinophils Absolute: 0 10*3/uL (ref 0.0–0.7)
Eosinophils Relative: 0 %
HEMATOCRIT: 36.9 % (ref 36.0–46.0)
HEMOGLOBIN: 12.1 g/dL (ref 12.0–15.0)
LYMPHS PCT: 9 %
Lymphs Abs: 0.9 10*3/uL (ref 0.7–4.0)
MCH: 28.3 pg (ref 26.0–34.0)
MCHC: 32.8 g/dL (ref 30.0–36.0)
MCV: 86.2 fL (ref 78.0–100.0)
Monocytes Absolute: 0.2 10*3/uL (ref 0.1–1.0)
Monocytes Relative: 2 %
NEUTROS ABS: 8.6 10*3/uL — AB (ref 1.7–7.7)
Neutrophils Relative %: 89 %
Platelets: 237 10*3/uL (ref 150–400)
RBC: 4.28 MIL/uL (ref 3.87–5.11)
RDW: 12.3 % (ref 11.5–15.5)
WBC: 9.7 10*3/uL (ref 4.0–10.5)

## 2017-09-05 LAB — COMPREHENSIVE METABOLIC PANEL
ALT: 15 U/L (ref 14–54)
AST: 33 U/L (ref 15–41)
Albumin: 4.5 g/dL (ref 3.5–5.0)
Alkaline Phosphatase: 43 U/L (ref 38–126)
Anion gap: 15 (ref 5–15)
BUN: 8 mg/dL (ref 6–20)
CHLORIDE: 103 mmol/L (ref 101–111)
CO2: 20 mmol/L — AB (ref 22–32)
Calcium: 9.5 mg/dL (ref 8.9–10.3)
Creatinine, Ser: 1 mg/dL (ref 0.44–1.00)
Glucose, Bld: 162 mg/dL — ABNORMAL HIGH (ref 65–99)
POTASSIUM: 3.4 mmol/L — AB (ref 3.5–5.1)
SODIUM: 138 mmol/L (ref 135–145)
Total Bilirubin: 1.1 mg/dL (ref 0.3–1.2)
Total Protein: 7.9 g/dL (ref 6.5–8.1)

## 2017-09-05 LAB — RAPID URINE DRUG SCREEN, HOSP PERFORMED
Amphetamines: NOT DETECTED
BENZODIAZEPINES: NOT DETECTED
Barbiturates: NOT DETECTED
COCAINE: NOT DETECTED
Opiates: NOT DETECTED
Tetrahydrocannabinol: POSITIVE — AB

## 2017-09-05 LAB — ETHANOL

## 2017-09-05 LAB — I-STAT BETA HCG BLOOD, ED (MC, WL, AP ONLY): I-stat hCG, quantitative: <5 m[IU]/mL (ref <5)

## 2017-09-05 MED ORDER — SODIUM CHLORIDE 0.9 % IV BOLUS (SEPSIS)
1000.0000 mL | Freq: Once | INTRAVENOUS | Status: AC
  Administered 2017-09-05: 1000 mL via INTRAVENOUS

## 2017-09-05 MED ORDER — LORAZEPAM 2 MG/ML IJ SOLN
1.0000 mg | Freq: Once | INTRAMUSCULAR | Status: AC
  Administered 2017-09-05: 1 mg via INTRAVENOUS
  Filled 2017-09-05: qty 1

## 2017-09-05 MED ORDER — ONDANSETRON HCL 4 MG/2ML IJ SOLN
4.0000 mg | Freq: Once | INTRAMUSCULAR | Status: AC
  Administered 2017-09-05: 4 mg via INTRAVENOUS
  Filled 2017-09-05: qty 2

## 2017-09-05 MED ORDER — POTASSIUM CHLORIDE 10 MEQ/100ML IV SOLN
10.0000 meq | INTRAVENOUS | Status: AC
  Administered 2017-09-06 (×2): 10 meq via INTRAVENOUS
  Filled 2017-09-05 (×2): qty 100

## 2017-09-05 MED ORDER — METOCLOPRAMIDE HCL 5 MG/ML IJ SOLN
10.0000 mg | Freq: Once | INTRAMUSCULAR | Status: AC
  Administered 2017-09-05: 10 mg via INTRAVENOUS
  Filled 2017-09-05: qty 2

## 2017-09-05 NOTE — ED Notes (Signed)
Patient still vomiting.  Did not give her anything to drink at this time.

## 2017-09-05 NOTE — H&P (Signed)
TRH H&P   Patient Demographics:    Charlotte Scott, is a 26 y.o. female  MRN: [id number]   DOB - Sep 18, 1991  Admit Date - 09/05/2017  Outpatient Primary MD for the patient is Patient, No Pcp Per  Referring MD/NP/PA:  Dr. Laverta Baltimore   Outpatient Specialists:   Patient coming from:   home  Chief Complaint  Patient presents with  . Dizziness      HPI:    Charlotte Scott  is a 26 y.o. female, w anxiety, depression c/o smoked weed last nite and has not been feeling well today.  Pt seems nervous and had n/v multiple times.  Denies fever, chills, headache,  abd pain, diarrhea, brbpr.  Not sure when last bm was.    Pt was brought by EMS which was called by her friend.   In ED,  CT head=>  IMPRESSION: No acute intracranial abnormalities. Inflammatory changes in the paranasal sinuses.  UDS + marijuana  K 3.4  Glucose 162 Hco3 162 ua ketone +  Wbc 9.7, Hgb 12.1 Plt 237  Pt will be admitted for n/v, and altered mental status.      Review of systems:    In addition to the HPI above,  Slightly anxious, paranoid No obvious av hallucinations  No Fever-chills, No Headache, No changes with Vision or hearing, No problems swallowing food or Liquids, No Chest pain, Cough or Shortness of Breath, No Abdominal pain,  No Blood in stool or Urine, No dysuria, No new skin rashes or bruises, No new joints pains-aches,  No new weakness, tingling, numbness in any extremity, No recent weight gain or loss, No polyuria, polydypsia or polyphagia, No significant Mental Stressors.  A full 10 point Review of Systems was done, except as stated above, all other Review of Systems were negative.   With Past History of the following :    Past Medical History:  Diagnosis Date  . Anxiety   . Depression       Past Surgical History:  Procedure Laterality Date  . NASAL SINUS SURGERY          Social History:     Social History   Tobacco Use  . Smoking status: Never Smoker  . Smokeless tobacco: Never Used  Substance Use Topics  . Alcohol use: No     Lives - at home  Mobility - walks by self   Family History :     Family History  Problem Relation Age of Onset  . Cancer Maternal Aunt   . Cancer Paternal Grandfather       Home Medications:   Prior to Admission medications   Medication Sig Start Date End Date Taking? Authorizing Provider  sertraline (ZOLOFT) 50 MG tablet Take 1 tablet (50 mg total) by mouth daily. 05/08/17  Yes Derrill Center, NP  norgestimate-ethinyl estradiol (ORTHO-CYCLEN,SPRINTEC,PREVIFEM) 0.25-35 MG-MCG tablet Take 1 tablet by  mouth daily. Patient not taking: Reported on 02/06/2017 08/16/16   Rasch, Anderson Malta I, NP  traZODone (DESYREL) 50 MG tablet Take 1 tablet (50 mg total) by mouth at bedtime and may repeat dose one time if needed. Patient not taking: Reported on 09/05/2017 05/07/17   Derrill Center, NP     Allergies:    No Known Allergies   Physical Exam:   Vitals  Blood pressure 117/60, pulse 62, temperature 99.1 F (37.3 C), temperature source Oral, resp. rate (!) 21, height 5' 6" (1.676 m), weight 56.7 kg (125 lb), SpO2 97 %, unknown if currently breastfeeding.   1. General  lying in bed in NAD,    2. Normal affect and insight, Not Suicidal or Homicidal, Awake Alert, Oriented X 3.  3. No F.N deficits, ALL C.Nerves Intact, Strength 5/5 all 4 extremities, Sensation intact all 4 extremities, Plantars down going.  4. Ears and Eyes appear Normal, Conjunctivae clear, PERRLA. Moist Oral Mucosa.  5. Supple Neck, No JVD, No cervical lymphadenopathy appriciated, No Carotid Bruits.  6. Symmetrical Chest wall movement, Good air movement bilaterally, CTAB.  7. RRR, No Gallops, Rubs or Murmurs, No Parasternal Heave.  8. Positive Bowel Sounds, Abdomen Soft, No tenderness, No organomegaly appriciated,No rebound -guarding or  rigidity.  9.  No Cyanosis, Normal Skin Turgor, No Skin Rash or Bruise.  10. Good muscle tone,  joints appear normal , no effusions, Normal ROM.  11. No Palpable Lymph Nodes in Neck or Axillae     Data Review:    CBC Recent Labs  Lab 09/05/17 1903  WBC 9.7  HGB 12.1  HCT 36.9  PLT 237  MCV 86.2  MCH 28.3  MCHC 32.8  RDW 12.3  LYMPHSABS 0.9  MONOABS 0.2  EOSABS 0.0  BASOSABS 0.0   ------------------------------------------------------------------------------------------------------------------  Chemistries  Recent Labs  Lab 09/05/17 1903  NA 138  K 3.4*  CL 103  CO2 20*  GLUCOSE 162*  BUN 8  CREATININE 1.00  CALCIUM 9.5  AST 33  ALT 15  ALKPHOS 43  BILITOT 1.1   ------------------------------------------------------------------------------------------------------------------ estimated creatinine clearance is 77.6 mL/min (by C-G formula based on SCr of 1 mg/dL). ------------------------------------------------------------------------------------------------------------------ No results for input(s): TSH, T4TOTAL, T3FREE, THYROIDAB in the last 72 hours.  Invalid input(s): FREET3  Coagulation profile No results for input(s): INR, PROTIME in the last 168 hours. ------------------------------------------------------------------------------------------------------------------- No results for input(s): DDIMER in the last 72 hours. -------------------------------------------------------------------------------------------------------------------  Cardiac Enzymes No results for input(s): CKMB, TROPONINI, MYOGLOBIN in the last 168 hours.  Invalid input(s): CK ------------------------------------------------------------------------------------------------------------------ No results found for: BNP   ---------------------------------------------------------------------------------------------------------------  Urinalysis    Component Value Date/Time    COLORURINE YELLOW 09/05/2017 2024   APPEARANCEUR HAZY (A) 09/05/2017 2024   LABSPEC 1.018 09/05/2017 2024   PHURINE 9.0 (H) 09/05/2017 2024   GLUCOSEU NEGATIVE 09/05/2017 2024   HGBUR NEGATIVE 09/05/2017 2024   BILIRUBINUR NEGATIVE 09/05/2017 2024   KETONESUR 80 (A) 09/05/2017 2024   PROTEINUR 30 (A) 09/05/2017 2024   UROBILINOGEN 0.2 12/25/2014 1950   NITRITE NEGATIVE 09/05/2017 2024   LEUKOCYTESUR NEGATIVE 09/05/2017 2024    ----------------------------------------------------------------------------------------------------------------   Imaging Results:    Ct Head Wo Contrast  Result Date: 09/05/2017 CLINICAL DATA:  Patient was seen laying around today. Complains of dizziness, standing up and 4 episodes vomiting today. History of anxiety; depression. EXAM: CT HEAD WITHOUT CONTRAST TECHNIQUE: Contiguous axial images were obtained from the base of the skull through the vertex without intravenous contrast. COMPARISON:  None. FINDINGS: Brain:  No evidence of acute infarction, hemorrhage, hydrocephalus, extra-axial collection or mass lesion/mass effect. Vascular: No hyperdense vessel or unexpected calcification. Skull: Normal. Negative for fracture or focal lesion. Sinuses/Orbits: Opacification of the ethmoid air cells with mucosal thickening in the maxillary antra. No acute air-fluid levels. Mastoid air cells are not opacified. Other: None. IMPRESSION: No acute intracranial abnormalities. Inflammatory changes in the paranasal sinuses. Electronically Signed   By: Lucienne Capers M.D.   On: 09/05/2017 22:45      Assessment & Plan:    Principal Problem:   Nausea & vomiting    Nausea and vomitting/ diarrhea zofran iv abd xray RUQ ultrasound Check stool studies  AMS ? Marijuana use Check b12, folate, esr, ana, rpr, tsh    DVT Prophylaxis - SCDs   AM Labs Ordered, also please review Full Orders  Family Communication: Admission, patients condition and plan of care including  tests being ordered have been discussed with the patient who indicate understanding and agree with the plan and Code Status.  Code Status FULL CODE  Likely DC to  home  Condition GUARDED    Consults called:  none  Admission status: observation  Time spent in minutes : 45   Jani Gravel M.D on 09/05/2017 at 11:27 PM  Between 7am to 7pm - Pager - [phone]. After 7pm go to [email or web address] - password [facility]  [facility] - Office  [phone]

## 2017-09-05 NOTE — ED Provider Notes (Addendum)
Emergency Department Provider Note   I have reviewed the triage vital signs and the nursing notes.   HISTORY  Chief Complaint Dizziness   HPI Charlotte Scott is a 26 y.o. female with PMH of anxiety and depression presents to the emergency department for evaluation of sudden onset nausea and vomiting with lightheadedness.  She describes symptoms beginning suddenly.  She felt a tingling sensation over her entire face.  She denies any chest pain, difficulty breathing, abdominal pain.  Her last menstrual period was 2 weeks prior.  The patient denies any alcohol or drug use.  No associated diarrhea.  No fevers or chills.  Patient states that she was started on a new medication for depression within the last 3 weeks and states she has been compliant with that. The patient repeats over and over that she feels like she might pass out.   Level 5 caveat: Patient is difficult to focus and re-direct at times.    Past Medical History:  Diagnosis Date  . Anxiety   . Depression     Patient Active Problem List   Diagnosis Date Noted  . Persistent depressive disorder 05/05/2017  . Adjustment disorder 05/05/2017  . 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 NASAL SINUS SURGERY      Current Outpatient Rx  . Order #: [id number] Class: Print  . Order #: [id number] Class: Normal  . Order #: [id number] Class: Print    Allergies Patient has no known allergies.  Family History  Problem Relation Age of Onset  . Cancer Maternal Aunt   . Cancer Paternal Grandfather     Social History Social History   Tobacco Use  . Smoking status: Never Smoker  . Smokeless tobacco: Never Used  Substance Use Topics  . Alcohol use: No  . Drug use: Yes    Types: Marijuana    Review of Systems  Constitutional: No fever/chills. Positive lightheadedness.  Eyes: No visual changes. ENT: No sore  throat. Cardiovascular: Denies chest pain. Respiratory: Denies shortness of breath. Gastrointestinal: No abdominal pain. Positive nausea and vomiting.  No diarrhea.  No constipation. Genitourinary: Negative for dysuria. Musculoskeletal: Negative for back pain. Skin: Negative for rash. Neurological: Negative for headaches, focal weakness or numbness. Positive tingling around the mouth.   10-point ROS otherwise negative.  ____________________________________________   PHYSICAL EXAM:  VITAL SIGNS: ED Triage Vitals  Enc Vitals Group     BP 09/05/17 1855 130/76     Pulse Rate 09/05/17 1855 60     Resp 09/05/17 1855 18     Temp --      Temp src --      SpO2 09/05/17 1855 98 %     Weight 09/05/17 1856 125 lb (56.7 kg)     Height 09/05/17 1856 5\' 6"  (1.676 m)   Constitutional: Alert but agitated and difficult to re-direct and hold the patient's focus.  Eyes: Conjunctivae are normal. PERRL.  Head: Atraumatic. Nose: No congestion/rhinnorhea. Mouth/Throat: Mucous membranes are moist.  Neck: No stridor.  Cardiovascular: Normal rate, regular rhythm. Good peripheral circulation. Grossly normal heart sounds.   Respiratory: Normal respiratory effort.  No retractions. Lungs CTAB. Gastrointestinal: Soft and nontender. No distention.  Musculoskeletal: No lower extremity tenderness nor edema. No gross deformities of extremities. Neurologic:  Normal speech and language. No gross focal neurologic deficits are appreciated.  Skin:  Skin is warm, dry and intact. No rash noted.  ____________________________________________  LABS (all labs ordered are listed, but only abnormal results are displayed)  Labs Reviewed  COMPREHENSIVE METABOLIC PANEL - Abnormal; Notable for the following components:      Result Value   Potassium 3.4 (*)    CO2 20 (*)    Glucose, Bld 162 (*)    All other components within normal limits  CBC WITH DIFFERENTIAL/PLATELET - Abnormal; Notable for the following  components:   Neutro Abs 8.6 (*)    All other components within normal limits  URINALYSIS, ROUTINE W REFLEX MICROSCOPIC - Abnormal; Notable for the following components:   APPearance HAZY (*)    pH 9.0 (*)    Ketones, ur 80 (*)    Protein, ur 30 (*)    Bacteria, UA RARE (*)    Squamous Epithelial / LPF 6-30 (*)    All other components within normal limits  RAPID URINE DRUG SCREEN, HOSP PERFORMED - Abnormal; Notable for the following components:   Tetrahydrocannabinol POSITIVE (*)    All other components within normal limits  ETHANOL  CBG MONITORING, ED  I-STAT BETA HCG BLOOD, ED ([facility], [facility], [facility] ONLY)   ____________________________________________  EKG  Rate: 64 PR: 128 QTc: 468  Sinus rhythm. Normal axis. Narrow QRS. No ST elevation or depression.  ____________________________________________  RADIOLOGY  Ct Head Wo Contrast  Result Date: 09/05/2017 CLINICAL DATA:  Patient was seen laying around today. Complains of dizziness, standing up and 4 episodes vomiting today. History of anxiety; depression. EXAM: CT HEAD WITHOUT CONTRAST TECHNIQUE: Contiguous axial images were obtained from the base of the skull through the vertex without intravenous contrast. COMPARISON:  None. FINDINGS: Brain: No evidence of acute infarction, hemorrhage, hydrocephalus, extra-axial collection or mass lesion/mass effect. Vascular: No hyperdense vessel or unexpected calcification. Skull: Normal. Negative for fracture or focal lesion. Sinuses/Orbits: Opacification of the ethmoid air cells with mucosal thickening in the maxillary antra. No acute air-fluid levels. Mastoid air cells are not opacified. Other: None. IMPRESSION: No acute intracranial abnormalities. Inflammatory changes in the paranasal sinuses. Electronically Signed   By: Burman Nieves M.D.   On: 09/05/2017 22:45    ____________________________________________   PROCEDURES  Procedure(s) performed:    Procedures  None ____________________________________________   INITIAL IMPRESSION / ASSESSMENT AND PLAN / ED COURSE  Pertinent labs & imaging results that were available during my care of the patient were reviewed by me and considered in my medical decision making (see chart for details).  Patient presents to the emergency department for evaluation of sudden onset nausea and vomiting with lightheadedness and near syncope.  She is somewhat agitated on arrival and has to be repeatedly reminded to not remove her IV.  She is able to give a relatively coherent history.  She has no focal neurological deficits.  She denies any alcohol or drug intoxication. Afebrile. No focal neuro deficits.   09:08 PM Patient feeling better after Ativan. Continue IVF and reassess. No vomiting.   09:15 PM Shortly after leaving the room the nurse reports that the patient had an additional vomiting episode. Plan for CT head and will give Reglan. No abdominal tenderness. Patient denies any HA/neck pain. Denies abdominal pain.   CT negative. Patient drowsy but alert and oriented x 4. Remains afebrile. With continued vomiting plan for overnight observation for intractable nausea and vomiting. Suspect hyperemesis related to marijuana use.   Discussed patient's case with Hospitalist, Dr. Selena Batten to request admission. Patient and family (if present) updated with plan. Care transferred to Hospitalist service.  I  reviewed all nursing notes, vitals, pertinent old records, EKGs, labs, imaging (as available).  ____________________________________________  FINAL CLINICAL IMPRESSION(S) / ED DIAGNOSES  Final diagnoses:  Intractable vomiting with nausea, unspecified vomiting type     MEDICATIONS GIVEN DURING THIS VISIT:  Medications  sodium chloride 0.9 % bolus 1,000 mL (0 mLs Intravenous Stopped 09/05/17 1958)  ondansetron (ZOFRAN) injection 4 mg (4 mg Intravenous Given 09/05/17 2025)  LORazepam (ATIVAN) injection 1  mg (1 mg Intravenous Given 09/05/17 2034)  sodium chloride 0.9 % bolus 1,000 mL (0 mLs Intravenous Stopped 09/05/17 2253)  metoCLOPramide (REGLAN) injection 10 mg (10 mg Intravenous Given 09/05/17 2115)     Note:  This document was prepared using Dragon voice recognition software and may include unintentional dictation errors.  Alona BeneJoshua Bellina Tokarczyk, MD Emergency Medicine    Parth Mccormac, Arlyss RepressJoshua G, MD 09/05/17 [id number]    Maia PlanLong, Adelai Achey G, MD 09/05/17 [phone]

## 2017-09-05 NOTE — ED Notes (Signed)
Patient vomited on floor

## 2017-09-05 NOTE — ED Notes (Signed)
Pt vomiting again.

## 2017-09-05 NOTE — ED Triage Notes (Signed)
Per EMS: Pt was laying around today and started to have N/V. Pt has vomited 4 times today. Pt states that when she stood up she became dizzy. Pt denies drugs or ETOH.  A&Ox4. Pt has history of anxiety.  Given 4 mg of Zofran IV.

## 2017-09-06 ENCOUNTER — Observation Stay (HOSPITAL_COMMUNITY): Payer: BLUE CROSS/BLUE SHIELD

## 2017-09-06 ENCOUNTER — Other Ambulatory Visit: Payer: Self-pay

## 2017-09-06 DIAGNOSIS — R112 Nausea with vomiting, unspecified: Secondary | ICD-10-CM | POA: Diagnosis not present

## 2017-09-06 LAB — COMPREHENSIVE METABOLIC PANEL
ALBUMIN: 3.8 g/dL (ref 3.5–5.0)
ALK PHOS: 40 U/L (ref 38–126)
ALT: 13 U/L — ABNORMAL LOW (ref 14–54)
ANION GAP: 8 (ref 5–15)
AST: 24 U/L (ref 15–41)
BILIRUBIN TOTAL: 0.9 mg/dL (ref 0.3–1.2)
BUN: 8 mg/dL (ref 6–20)
CALCIUM: 8.5 mg/dL — AB (ref 8.9–10.3)
CO2: 21 mmol/L — ABNORMAL LOW (ref 22–32)
Chloride: 107 mmol/L (ref 101–111)
Creatinine, Ser: 0.76 mg/dL (ref 0.44–1.00)
GFR calc Af Amer: >60 mL/min (ref >60)
GFR calc non Af Amer: >60 mL/min (ref >60)
GLUCOSE: 122 mg/dL — AB (ref 65–99)
Potassium: 3.7 mmol/L (ref 3.5–5.1)
Sodium: 136 mmol/L (ref 135–145)
TOTAL PROTEIN: 6.9 g/dL (ref 6.5–8.1)

## 2017-09-06 LAB — CBC
HCT: 32.7 % — ABNORMAL LOW (ref 36.0–46.0)
HEMOGLOBIN: 10.8 g/dL — AB (ref 12.0–15.0)
MCH: 28.7 pg (ref 26.0–34.0)
MCHC: 33 g/dL (ref 30.0–36.0)
MCV: 87 fL (ref 78.0–100.0)
PLATELETS: 194 10*3/uL (ref 150–400)
RBC: 3.76 MIL/uL — ABNORMAL LOW (ref 3.87–5.11)
RDW: 12.6 % (ref 11.5–15.5)
WBC: 8.2 10*3/uL (ref 4.0–10.5)

## 2017-09-06 LAB — PREGNANCY, URINE: Preg Test, Ur: NEGATIVE

## 2017-09-06 LAB — VITAMIN B12: VITAMIN B 12: 477 pg/mL (ref 180–914)

## 2017-09-06 LAB — SEDIMENTATION RATE: Sed Rate: 13 mm/hr (ref 0–22)

## 2017-09-06 LAB — TSH: TSH: 0.647 u[IU]/mL (ref 0.350–4.500)

## 2017-09-06 MED ORDER — SODIUM CHLORIDE 0.9 % IV SOLN
INTRAVENOUS | Status: DC
  Administered 2017-09-06: 08:00:00 via INTRAVENOUS

## 2017-09-06 MED ORDER — ENOXAPARIN SODIUM 40 MG/0.4ML ~~LOC~~ SOLN
40.0000 mg | SUBCUTANEOUS | Status: DC
  Administered 2017-09-06: 40 mg via SUBCUTANEOUS
  Filled 2017-09-06: qty 0.4

## 2017-09-06 MED ORDER — ACETAMINOPHEN 325 MG PO TABS: 650.0000 mg | ORAL_TABLET | Freq: Four times a day (QID) | ORAL | Status: DC | PRN

## 2017-09-06 MED ORDER — ONDANSETRON HCL 4 MG PO TABS: 4.0000 mg | ORAL_TABLET | Freq: Every day | ORAL | 0 refills | Status: DC | PRN

## 2017-09-06 MED ORDER — FAMOTIDINE 20 MG PO TABS: 20.0000 mg | ORAL_TABLET | Freq: Two times a day (BID) | ORAL | 0 refills | Status: DC

## 2017-09-06 MED ORDER — SERTRALINE HCL 50 MG PO TABS
50.0000 mg | ORAL_TABLET | Freq: Every day | ORAL | Status: DC
  Administered 2017-09-06: 50 mg via ORAL
  Filled 2017-09-06: qty 1

## 2017-09-06 MED ORDER — FAMOTIDINE 20 MG PO TABS: 20.0000 mg | ORAL_TABLET | Freq: Two times a day (BID) | ORAL | 0 refills | Status: AC

## 2017-09-06 MED ORDER — ONDANSETRON HCL 4 MG PO TABS: 4.0000 mg | ORAL_TABLET | Freq: Every day | ORAL | 0 refills | Status: AC | PRN

## 2017-09-06 MED ORDER — ACETAMINOPHEN 650 MG RE SUPP: 650.0000 mg | Freq: Four times a day (QID) | RECTAL | Status: DC | PRN

## 2017-09-06 MED ORDER — ONDANSETRON HCL 4 MG/2ML IJ SOLN
4.0000 mg | Freq: Four times a day (QID) | INTRAMUSCULAR | Status: DC | PRN
  Administered 2017-09-06: 4 mg via INTRAVENOUS
  Filled 2017-09-06: qty 2

## 2017-09-06 NOTE — ED Notes (Signed)
Pt has not had a Bowel movement since the order for C. Difficile was placed

## 2017-09-06 NOTE — ED Notes (Signed)
Pt ate and drank with no problems

## 2017-09-06 NOTE — ED Notes (Signed)
Admitting MD aware of pt wanting to be discharged

## 2017-09-06 NOTE — ED Notes (Signed)
Pt had one episode of vomiting 

## 2017-09-06 NOTE — ED Notes (Signed)
Pt bed changed and pt placed in gown

## 2017-09-06 NOTE — ED Notes (Signed)
Pt states she is feeling better and and wants to go home

## 2017-09-06 NOTE — Discharge Summary (Signed)
Physician Discharge Summary  Charlotte Scott  XBJ:[id number]  DOB: 1991/08/28  DOA: 09/05/2017 PCP: Patient, No Pcp Per  Admit date: 09/05/2017 Discharge date: 09/06/2017  Admitted From: Home  Disposition: Home  Recommendations for Outpatient Follow-up:  1. Follow up with PCP in 1-2 weeks   Discharge Condition: Stable CODE STATUS: Full code Diet recommendation: Regular   Brief/Interim Summary: For full details see H&P/progress note but in brief Charlotte Scott is a 26 year old female with no significant medical history other than severe depression presented to the emergency department complaining of nausea and vomiting after smoking cannabis, associated with dizziness.  Upon ED evaluation was found to be mild dehydrated UDS positive for marijuana.  CT of the head was performed due to dizziness which shows inflammatory changes in the paranasal sinuses.  Patient was treated with IV fluids and Zofran, nausea and vomiting subsequently resolved and tolerated soft diet.  Patient was prescribed Pepcid and Zofran and was advised to follow-up with her primary care physician.  Subjective: Patient seen and examined, report nausea has resolved.  Denies any abdominal pain, diarrhea and shortness of breath.  Patient patient to smoked weed and subsequently became nauseous which has happened in the past but this time did not resolve so she was scared and came to the emergency department.  Discharge Diagnoses/Hospital Course:  Emesis due to marijuana Drug cessation discussed Patient was treated with IV fluids and IV Zofran Prescription of Zofran and Pepcid was given Follow-up with PCP  Anxiety and depression  Continue home medications, denies SI/HI   On the day of the discharge the patient's vitals were stable, and no other acute medical condition were reported by patient. the patient was felt safe to be discharge to home   Discharge Instructions  You were cared for by a hospitalist during  your hospital stay. If you have any questions about your discharge medications or the care you received while you were in the hospital after you are discharged, you can call the unit and asked to speak with the hospitalist on call if the hospitalist that took care of you is not available. Once you are discharged, your primary care physician will handle any further medical issues. Please note that NO REFILLS for any discharge medications will be authorized once you are discharged, as it is imperative that you return to your primary care physician (or establish a relationship with a primary care physician if you do not have one) for your aftercare needs so that they can reassess your need for medications and monitor your lab values.  Discharge Instructions    Call MD for:  difficulty breathing, headache or visual disturbances   Complete by:  As directed    Call MD for:  extreme fatigue   Complete by:  As directed    Call MD for:  hives   Complete by:  As directed    Call MD for:  persistant dizziness or light-headedness   Complete by:  As directed    Call MD for:  persistant nausea and vomiting   Complete by:  As directed    Call MD for:  redness, tenderness, or signs of infection (pain, swelling, redness, odor or green/yellow discharge around incision site)   Complete by:  As directed    Call MD for:  severe uncontrolled pain   Complete by:  As directed    Call MD for:  temperature >100.4   Complete by:  As directed    Diet - low sodium heart  healthy   Complete by:  As directed    Increase activity slowly   Complete by:  As directed      Allergies as of 09/06/2017   No Known Allergies     Medication List    STOP taking these medications   traZODone 50 MG tablet Commonly known as:  DESYREL     TAKE these medications   famotidine 20 MG tablet Commonly known as:  PEPCID Take 1 tablet (20 mg total) by mouth 2 (two) times daily.   norgestimate-ethinyl estradiol 0.25-35 MG-MCG  tablet Commonly known as:  ORTHO-CYCLEN,SPRINTEC,PREVIFEM Take 1 tablet by mouth daily.   ondansetron 4 MG tablet Commonly known as:  ZOFRAN Take 1 tablet (4 mg total) by mouth daily as needed for nausea or vomiting.   sertraline 50 MG tablet Commonly known as:  ZOLOFT Take 1 tablet (50 mg total) by mouth daily.       No Known Allergies  Consultations: None   Procedures/Studies: Ct Head Wo Contrast  Result Date: 09/05/2017 CLINICAL DATA:  Patient was seen laying around today. Complains of dizziness, standing up and 4 episodes vomiting today. History of anxiety; depression. EXAM: CT HEAD WITHOUT CONTRAST TECHNIQUE: Contiguous axial images were obtained from the base of the skull through the vertex without intravenous contrast. COMPARISON:  None. FINDINGS: Brain: No evidence of acute infarction, hemorrhage, hydrocephalus, extra-axial collection or mass lesion/mass effect. Vascular: No hyperdense vessel or unexpected calcification. Skull: Normal. Negative for fracture or focal lesion. Sinuses/Orbits: Opacification of the ethmoid air cells with mucosal thickening in the maxillary antra. No acute air-fluid levels. Mastoid air cells are not opacified. Other: None. IMPRESSION: No acute intracranial abnormalities. Inflammatory changes in the paranasal sinuses. Electronically Signed   By: Burman NievesWilliam  Stevens M.D.   On: 09/05/2017 22:45   Dg Abd 2 Views  Result Date: 09/06/2017 CLINICAL DATA:  Sudden onset nausea and vomiting with lightheadedness. EXAM: ABDOMEN - 2 VIEW COMPARISON:  None. FINDINGS: Scattered gas and stool in the colon. No small or large bowel distention. No free intra-abdominal air. No abnormal air-fluid levels. No radiopaque stones. Visualized bones appear intact. IMPRESSION: Nonobstructive bowel gas pattern. Electronically Signed   By: Burman NievesWilliam  Stevens M.D.   On: 09/06/2017 01:21   [place] Abdomen Limited Ruq  Result Date: 09/06/2017 CLINICAL DATA:  Nausea and vomiting EXAM:  ULTRASOUND ABDOMEN LIMITED RIGHT UPPER QUADRANT COMPARISON:  None. FINDINGS: Gallbladder: No gallstones or wall thickening visualized. No sonographic Murphy sign noted by sonographer. Common bile duct: Diameter: 1.8 mm, normal Liver: No focal lesion identified. Within normal limits in parenchymal echogenicity. Portal vein is patent on color Doppler imaging with normal direction of blood flow towards the liver. IMPRESSION: Normal examination. Electronically Signed   By: Burman NievesWilliam  Stevens M.D.   On: 09/06/2017 00:23    Discharge Exam: Vitals:   09/06/17 1245 09/06/17 1315  BP:    Pulse: (!) 56 66  Resp: (!) 21 18  Temp:    SpO2: 97% 97%   Vitals:   09/06/17 1200 09/06/17 1237 09/06/17 1245 09/06/17 1315  BP:  108/63    Pulse:  97 (!) 56 66  Resp: (!) 22 16 (!) 21 18  Temp:      TempSrc:      SpO2:  99% 97% 97%  Weight:      Height:        General: Pt is alert, awake, not in acute distress Cardiovascular: RRR, S1/S2 +, no rubs, no gallops Respiratory: CTA bilaterally, no wheezing,  no rhonchi Abdominal: Soft, NT, ND, bowel sounds + Extremities: no edema, no cyanosis   The results of significant diagnostics from this hospitalization (including imaging, microbiology, ancillary and laboratory) are listed below for reference.     Microbiology: No results found for this or any previous visit (from the past 240 hour(s)).   Labs: BNP (last 3 results) No results for input(s): BNP in the last 8760 hours. Basic Metabolic Panel: Recent Labs  Lab 09/05/17 1903 09/06/17 0811  NA 138 136  K 3.4* 3.7  CL 103 107  CO2 20* 21*  GLUCOSE 162* 122*  BUN 8 8  CREATININE 1.00 0.76  CALCIUM 9.5 8.5*   Liver Function Tests: Recent Labs  Lab 09/05/17 1903 09/06/17 0811  AST 33 24  ALT 15 13*  ALKPHOS 43 40  BILITOT 1.1 0.9  PROT 7.9 6.9  ALBUMIN 4.5 3.8   No results for input(s): LIPASE, AMYLASE in the last 168 hours. No results for input(s): AMMONIA in the last 168  hours. CBC: Recent Labs  Lab 09/05/17 1903 09/06/17 0811  WBC 9.7 8.2  NEUTROABS 8.6*  --   HGB 12.1 10.8*  HCT 36.9 32.7*  MCV 86.2 87.0  PLT 237 194   Cardiac Enzymes: No results for input(s): CKTOTAL, CKMB, CKMBINDEX, TROPONINI in the last 168 hours. BNP: Invalid input(s): POCBNP CBG: No results for input(s): GLUCAP in the last 168 hours. D-Dimer No results for input(s): DDIMER in the last 72 hours. Hgb A1c No results for input(s): HGBA1C in the last 72 hours. Lipid Profile No results for input(s): CHOL, HDL, LDLCALC, TRIG, CHOLHDL, LDLDIRECT in the last 72 hours. Thyroid function studies Recent Labs    09/06/17 0811  TSH 0.647   Anemia work up Recent Labs    09/06/17 0811  VITAMINB12 477   Urinalysis    Component Value Date/Time   COLORURINE YELLOW 09/05/2017 2024   APPEARANCEUR HAZY (A) 09/05/2017 2024   LABSPEC 1.018 09/05/2017 2024   PHURINE 9.0 (H) 09/05/2017 2024   GLUCOSEU NEGATIVE 09/05/2017 2024   HGBUR NEGATIVE 09/05/2017 2024   BILIRUBINUR NEGATIVE 09/05/2017 2024   KETONESUR 80 (A) 09/05/2017 2024   PROTEINUR 30 (A) 09/05/2017 2024   UROBILINOGEN 0.2 12/25/2014 1950   NITRITE NEGATIVE 09/05/2017 2024   LEUKOCYTESUR NEGATIVE 09/05/2017 2024   Sepsis Labs Invalid input(s): PROCALCITONIN,  WBC,  LACTICIDVEN Microbiology No results found for this or any previous visit (from the past 240 hour(s)).   Time coordinating discharge: 25 minutes  SIGNED:  Latrelle DodrillEdwin Silva, MD  [facility] 09/06/2017, 5:58 PM  Pager please text page via  [email or web address] Password [email or web address]

## 2017-09-07 LAB — RPR: RPR: NONREACTIVE

## 2017-09-07 LAB — HIV ANTIBODY (ROUTINE TESTING W REFLEX): HIV SCREEN 4TH GENERATION: NONREACTIVE

## 2019-07-01 IMAGING — US US ABDOMEN LIMITED
1 series · 14 of 25 positions shown · non-contrast
Comparison: None.

CLINICAL DATA: Nausea and vomiting

EXAM:
ULTRASOUND ABDOMEN LIMITED RIGHT UPPER QUADRANT

[Series 1: us abdomen limited · 0.16mm/px · 14 of 26 slices shown]
[im 1/26]
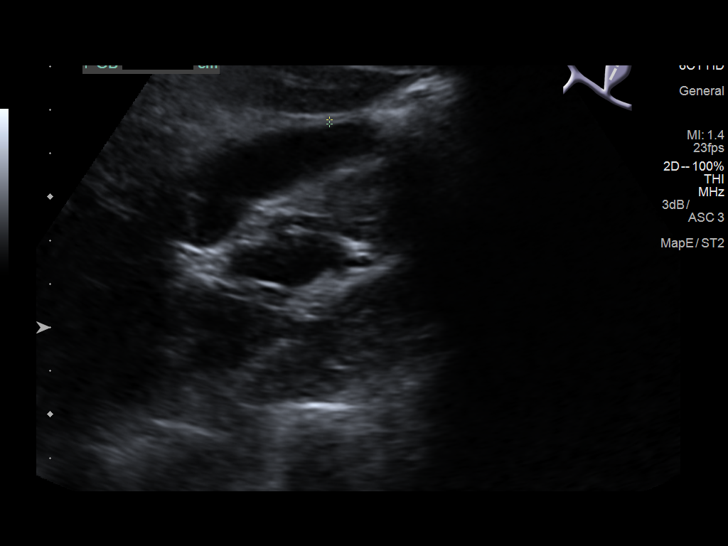
[im 3/26]
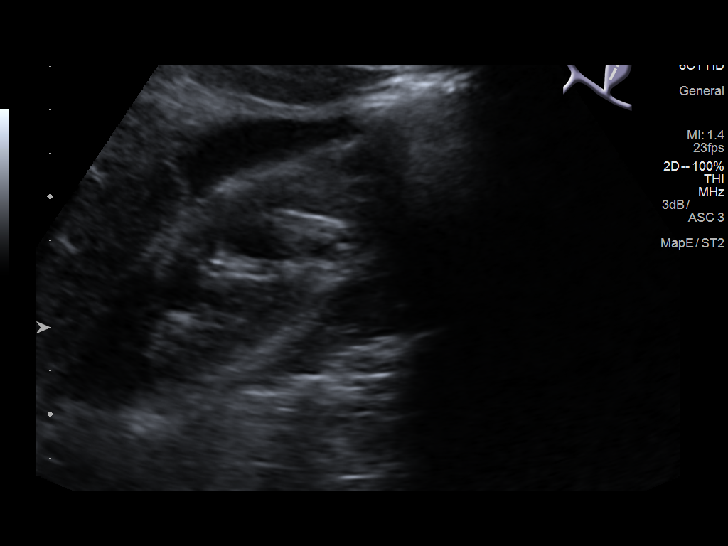
[im 5/26]
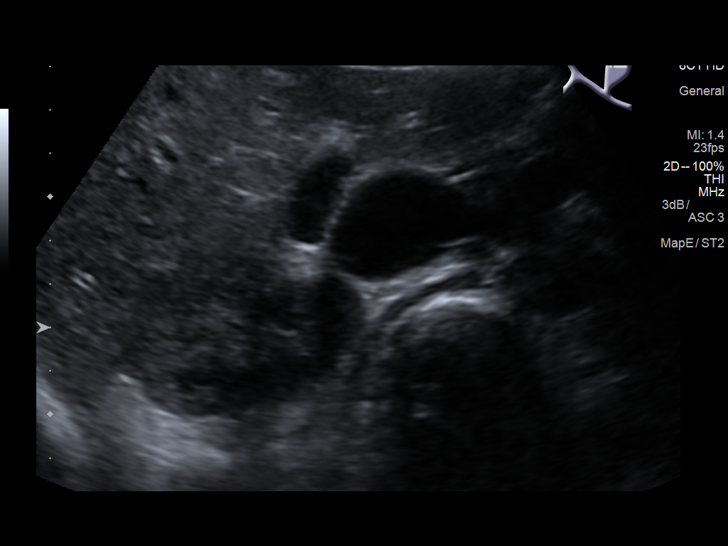
[im 7/26]
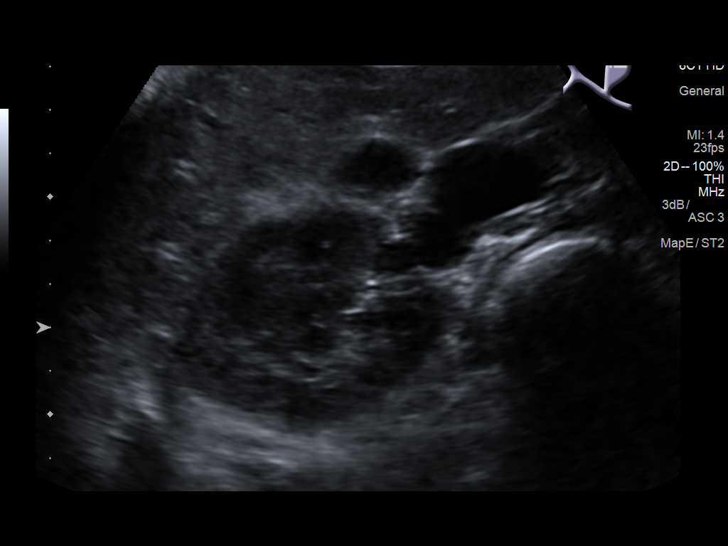
[im 9/26]
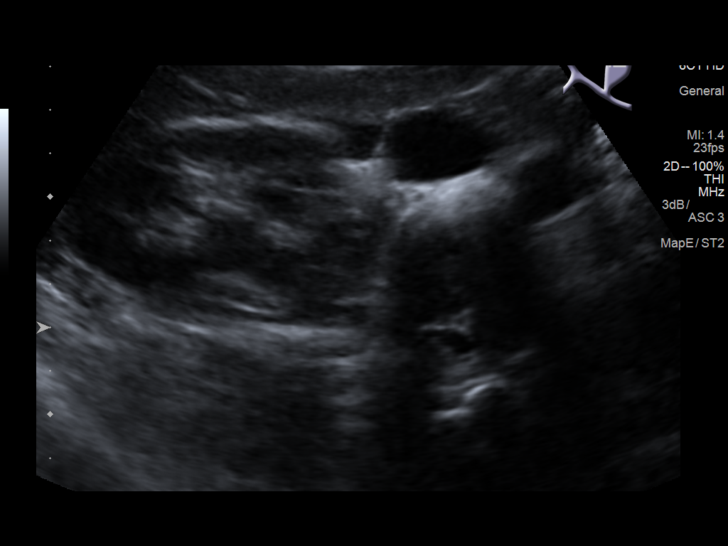
[im 10/26]
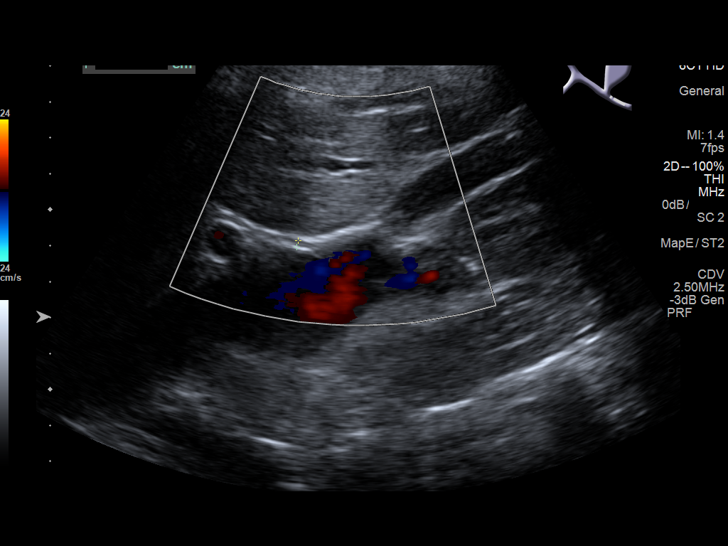
[im 12/26]
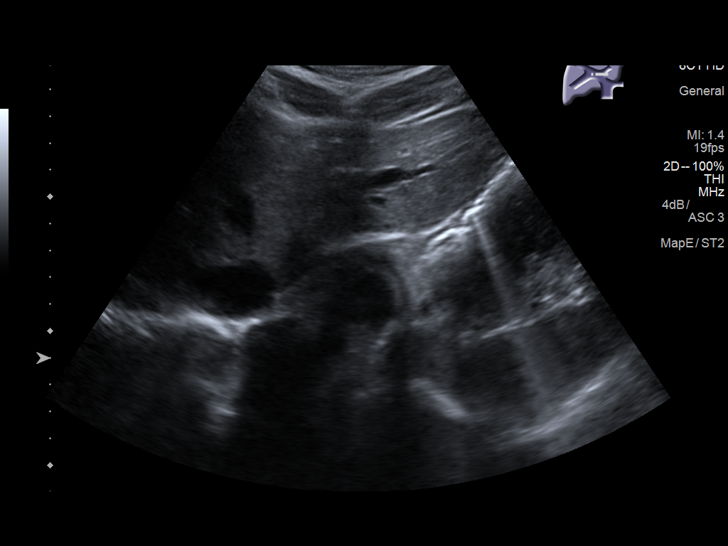
[im 14/26]
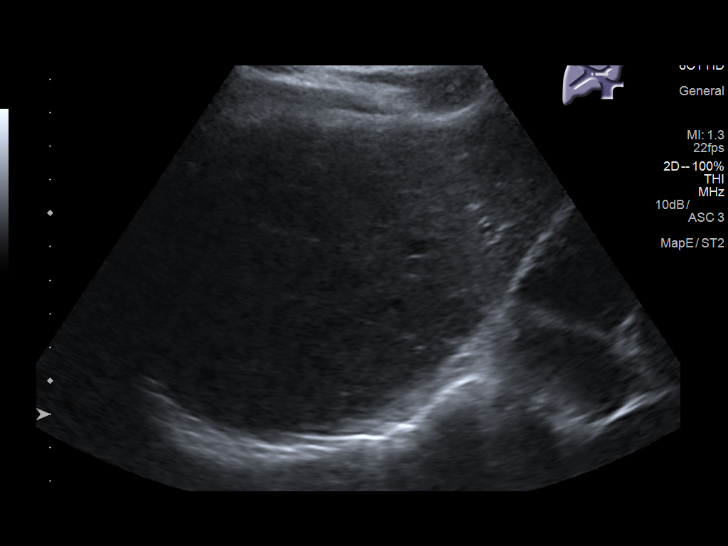
[im 16/26]
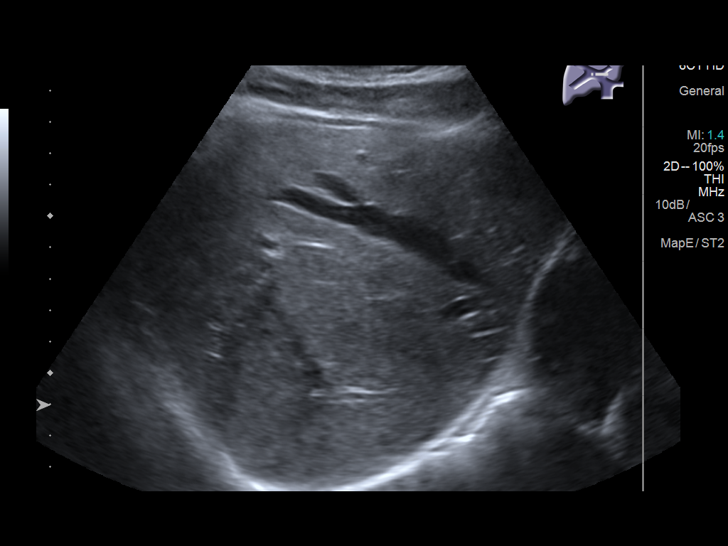
[im 17/26]
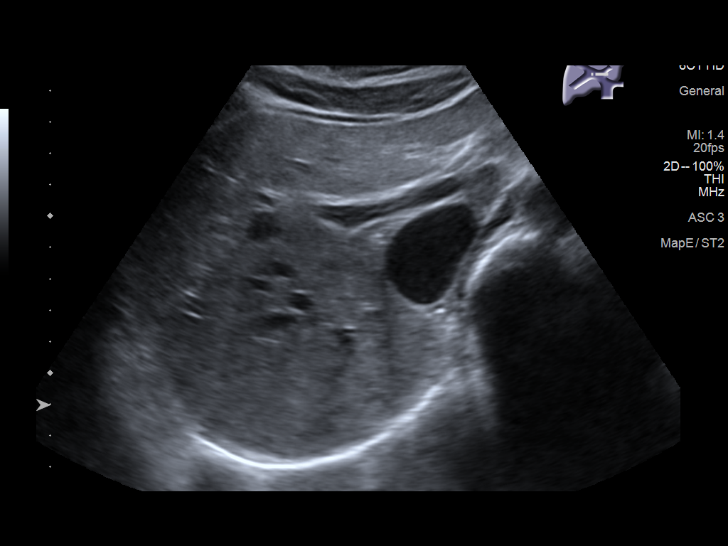
[im 19/26]
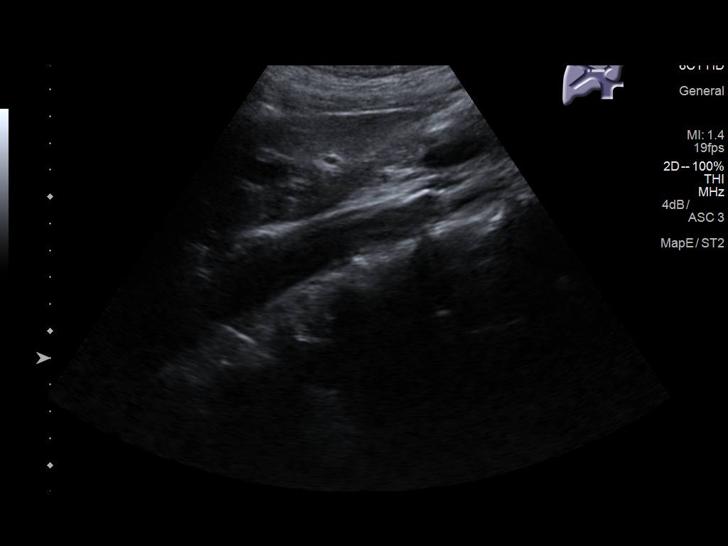
[im 21/26]
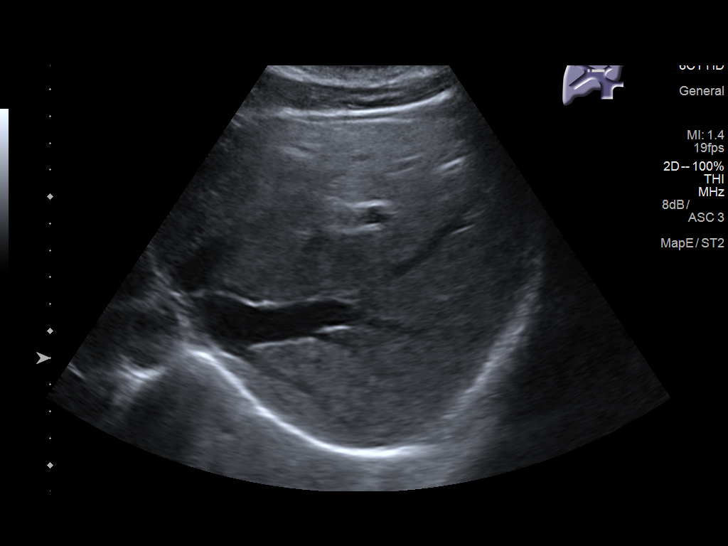
[im 23/26]
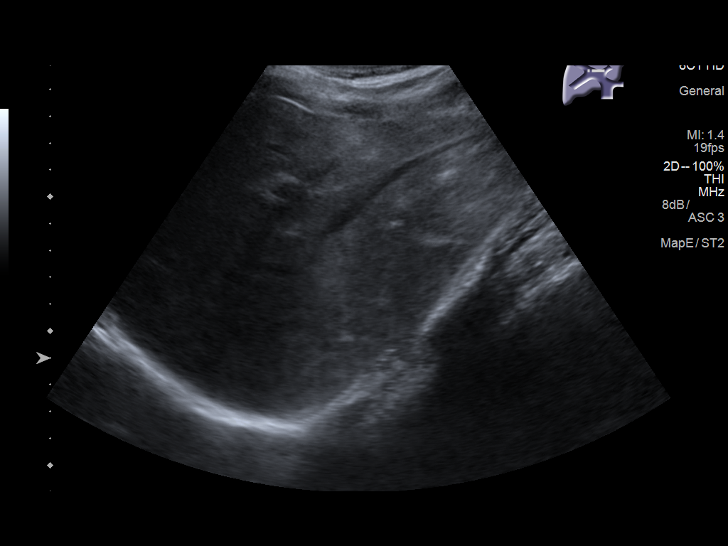
[im 26/26]
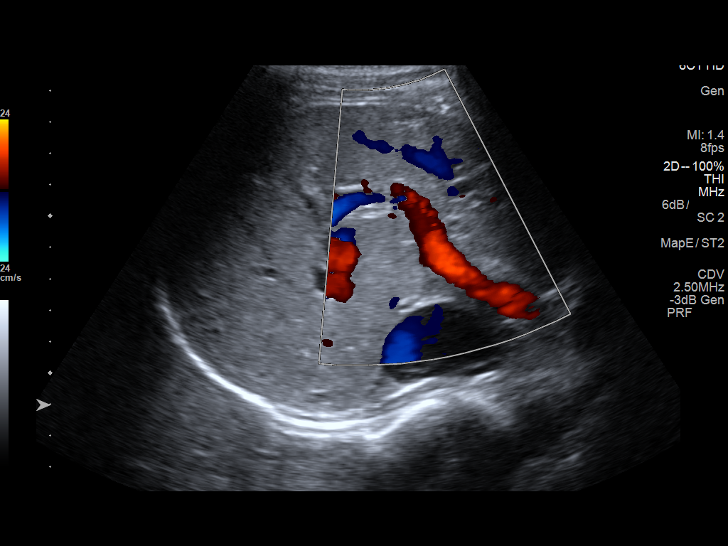

[14 of 25 positions shown; findings below may reference images not displayed]

FINDINGS: Gallbladder:

No gallstones or wall thickening visualized. No sonographic Murphy
sign noted by sonographer.

Common bile duct:

Diameter: 1.8 mm, normal

Liver:

No focal lesion identified. Within normal limits in parenchymal
echogenicity. Portal vein is patent on color Doppler imaging with
normal direction of blood flow towards the liver.
IMPRESSION: Normal examination.
